# Patient Record
Sex: Female | Born: 1985 | Race: Asian | Hispanic: No | Marital: Married | Smoking: Never smoker
Health system: Southern US, Community
[De-identification: ages and names within clinical notes are randomized; demographics above are authoritative.]

## PROBLEM LIST (undated history)

## (undated) ENCOUNTER — Inpatient Hospital Stay (HOSPITAL_COMMUNITY): Payer: Self-pay

## (undated) DIAGNOSIS — O139 Gestational [pregnancy-induced] hypertension without significant proteinuria, unspecified trimester: Secondary | ICD-10-CM

## (undated) DIAGNOSIS — K219 Gastro-esophageal reflux disease without esophagitis: Secondary | ICD-10-CM

## (undated) DIAGNOSIS — R51 Headache: Secondary | ICD-10-CM

## (undated) DIAGNOSIS — O24419 Gestational diabetes mellitus in pregnancy, unspecified control: Secondary | ICD-10-CM

## (undated) DIAGNOSIS — E785 Hyperlipidemia, unspecified: Secondary | ICD-10-CM

## (undated) DIAGNOSIS — L700 Acne vulgaris: Secondary | ICD-10-CM

## (undated) DIAGNOSIS — J189 Pneumonia, unspecified organism: Secondary | ICD-10-CM

## (undated) DIAGNOSIS — M25473 Effusion, unspecified ankle: Secondary | ICD-10-CM

## (undated) DIAGNOSIS — N926 Irregular menstruation, unspecified: Secondary | ICD-10-CM

## (undated) DIAGNOSIS — N393 Stress incontinence (female) (male): Secondary | ICD-10-CM

## (undated) DIAGNOSIS — Z8759 Personal history of other complications of pregnancy, childbirth and the puerperium: Secondary | ICD-10-CM

## (undated) DIAGNOSIS — R519 Headache, unspecified: Secondary | ICD-10-CM

## (undated) DIAGNOSIS — T8332XA Displacement of intrauterine contraceptive device, initial encounter: Secondary | ICD-10-CM

## (undated) DIAGNOSIS — Z8632 Personal history of gestational diabetes: Secondary | ICD-10-CM

## (undated) DIAGNOSIS — Z973 Presence of spectacles and contact lenses: Secondary | ICD-10-CM

## (undated) HISTORY — DX: Acne vulgaris: L70.0

## (undated) HISTORY — PX: TOOTH EXTRACTION: SUR596

## (undated) HISTORY — PX: HAND SURGERY: SHX662

## (undated) HISTORY — DX: Irregular menstruation, unspecified: N92.6

## (undated) HISTORY — DX: Hyperlipidemia, unspecified: E78.5

## (undated) HISTORY — DX: Effusion, unspecified ankle: M25.473

---

## 2013-01-17 ENCOUNTER — Ambulatory Visit (INDEPENDENT_AMBULATORY_CARE_PROVIDER_SITE_OTHER): Payer: BC Managed Care – PPO | Admitting: Family Medicine

## 2013-01-17 ENCOUNTER — Other Ambulatory Visit (HOSPITAL_COMMUNITY)
Admission: RE | Admit: 2013-01-17 | Discharge: 2013-01-17 | Disposition: A | Discharge status: Home / Self Care | Payer: BC Managed Care – PPO | Source: Ambulatory Visit | Attending: Family Medicine | Admitting: Family Medicine

## 2013-01-17 ENCOUNTER — Encounter: Payer: Self-pay | Admitting: Family Medicine

## 2013-01-17 VITALS — BP 98/78 | HR 81 | Ht 66.0 in | Wt 185.0 lb

## 2013-01-17 DIAGNOSIS — Z01419 Encounter for gynecological examination (general) (routine) without abnormal findings: Secondary | ICD-10-CM | POA: Insufficient documentation

## 2013-01-17 DIAGNOSIS — M7989 Other specified soft tissue disorders: Secondary | ICD-10-CM

## 2013-01-17 DIAGNOSIS — Z124 Encounter for screening for malignant neoplasm of cervix: Secondary | ICD-10-CM

## 2013-01-17 DIAGNOSIS — Z0000 Encounter for general adult medical examination without abnormal findings: Secondary | ICD-10-CM

## 2013-01-17 LAB — POCT URINALYSIS DIPSTICK
Bilirubin, UA: NEGATIVE
Blood, UA: NEGATIVE
Glucose, UA: NEGATIVE
Ketones, UA: NEGATIVE
Leukocytes, UA: NEGATIVE
Nitrite, UA: NEGATIVE
Protein, UA: NEGATIVE
Spec Grav, UA: 1.01
Urobilinogen, UA: NEGATIVE
pH, UA: 6

## 2013-01-17 MED ORDER — POTASSIUM CHLORIDE ER 20 MEQ PO TBCR: 20.0000 meq | EXTENDED_RELEASE_TABLET | Freq: Two times a day (BID) | ORAL | Status: DC

## 2013-01-17 MED ORDER — FUROSEMIDE 40 MG PO TABS: 40.0000 mg | ORAL_TABLET | Freq: Every day | ORAL | Status: DC

## 2013-01-17 MED ORDER — NORETHIN ACE-ETH ESTRAD-FE 1-20 MG-MCG PO TABS: 1.0000 | ORAL_TABLET | Freq: Every day | ORAL | Status: DC

## 2013-01-17 NOTE — Patient Instructions (Addendum)
1)  Weight - HCG vs [place]  Calorie Counting Diet A calorie counting diet requires you to eat the number of calories that are right for you in a day. Calories are the measurement of how much energy you get from the food you eat. Eating the right amount of calories is important for staying at a healthy weight. If you eat too many calories, your body will store them as fat and you may gain weight. If you eat too few calories, you may lose weight. Counting the number of calories you eat during a day will help you know if you are eating the right amount. A Registered Dietitian can determine how many calories you need in a day. The amount of calories needed varies from person to person. If your goal is to lose weight, you will need to eat fewer calories. Losing weight can benefit you if you are overweight or have health problems such as heart disease, high blood pressure, or diabetes. If your goal is to gain weight, you will need to eat more calories. Gaining weight may be necessary if you have a certain health problem that causes your body to need more energy. TIPS Whether you are increasing or decreasing the number of calories you eat during a day, it may be hard to get used to changes in what you eat and drink. The following are tips to help you keep track of the number of calories you eat.  Measure foods at home with measuring cups. This helps you know the amount of food and number of calories you are eating.  Restaurants often serve food in amounts that are larger than 1 serving. While eating out, estimate how many servings of a food you are given. For example, a serving of cooked rice is  cup or about the size of half of a fist. Knowing serving sizes will help you be aware of how much food you are eating at restaurants.  Ask for smaller portion sizes or child-size portions at restaurants.  Plan to eat half of a meal at a restaurant. Take the rest home or share the other half with a  friend.  Read the Nutrition Facts panel on food labels for calorie content and serving size. You can find out how many servings are in a package, the size of a serving, and the number of calories each serving has.  For example, a package might contain 3 cookies. The Nutrition Facts panel on that package says that 1 serving is 1 cookie. Below that, it will say there are 3 servings in the container. The calories section of the Nutrition Facts label says there are 90 calories. This means there are 90 calories in 1 cookie (1 serving). If you eat 1 cookie you have eaten 90 calories. If you eat all 3 cookies, you have eaten 270 calories (3 servings x 90 calories = 270 calories). The list below tells you how big or small some common portion sizes are.  1 oz.........4 stacked dice.  3 oz........Marland KitchenDeck of cards.  1 tsp.......Marland KitchenTip of little finger.  1 tbs......Marland KitchenMarland KitchenThumb.  2 tbs.......Marland KitchenGolf ball.   cup......Marland KitchenHalf of a fist.  1 cup.......Marland KitchenA fist. KEEP A FOOD LOG Write down every food item you eat, the amount you eat, and the number of calories in each food you eat during the day. At the end of the day, you can add up the total number of calories you have eaten. It may help to keep a list like the one  below. Find out the calorie information by reading the Nutrition Facts panel on food labels. Breakfast  Bran cereal (1 cup, 110 calories).  Fat-free milk ( cup, 45 calories). Snack  Apple (1 medium, 80 calories). Lunch  Spinach (1 cup, 20 calories).  Tomato ( medium, 20 calories).  Chicken breast strips (3 oz, 165 calories).  Shredded cheddar cheese ( cup, 110 calories).  Light [place] dressing (2 tbs, 60 calories).  Whole-wheat bread (1 slice, 80 calories).  Tub margarine (1 tsp, 35 calories).  Vegetable soup (1 cup, 160 calories). Dinner  Pork chop (3 oz, 190 calories).  Brown rice (1 cup, 215 calories).  Steamed broccoli ( cup, 20 calories).  Strawberries (1  cup, 65  calories).  Whipped cream (1 tbs, 50 calories). Daily Calorie Total: 1425 Document Released: 09/27/2005 Document Revised: 12/20/2011 Document Reviewed: 03/24/2007 [facility] Patient Information 2013 [place], [place].  Exercise to Lose Weight Exercise and a healthy diet may help you lose weight. Your doctor may suggest specific exercises. EXERCISE IDEAS AND TIPS  Choose low-cost things you enjoy doing, such as walking, bicycling, or exercising to workout videos.  Take stairs instead of the elevator.  Walk during your lunch break.  Park your car further away from work or school.  Go to a gym or an exercise class.  Start with 5 to 10 minutes of exercise each day. Build up to 30 minutes of exercise 4 to 6 days a week.  Wear shoes with good support and comfortable clothes.  Stretch before and after working out.  Work out until you breathe harder and your heart beats faster.  Drink extra water when you exercise.  Do not do so much that you hurt yourself, feel dizzy, or get very short of breath. Exercises that burn about 150 calories:  Running 1  miles in 15 minutes.  Playing volleyball for 45 to 60 minutes.  Washing and waxing a car for 45 to 60 minutes.  Playing touch football for 45 minutes.  Walking 1  miles in 35 minutes.  Pushing a stroller 1  miles in 30 minutes.  Playing basketball for 30 minutes.  Raking leaves for 30 minutes.  Bicycling 5 miles in 30 minutes.  Walking 2 miles in 30 minutes.  Dancing for 30 minutes.  Shoveling snow for 15 minutes.  Swimming laps for 20 minutes.  Walking up stairs for 15 minutes.  Bicycling 4 miles in 15 minutes.  Gardening for 30 to 45 minutes.  Jumping rope for 15 minutes.  Washing windows or floors for 45 to 60 minutes. Document Released: 10/30/2010 Document Revised: 12/20/2011 Document Reviewed: 10/30/2010 [facility] Patient Information 2013 [place], [place].

## 2013-01-17 NOTE — Progress Notes (Signed)
Subjective:     Patient ID: Laura Sexton, female   DOB: 02-04-1986, 27 y.o.   MRN: [id number]  HPI Laura Sexton is here today for her annual CPE with pap.  She has done well since her last office visit. Her LMP is on 12/06/12.  She has been taking Loestrin to regulate her periods but she does not feel that they are regular.   Review of Systems  Constitutional: Negative for fever, activity change and unexpected weight change.  HENT: Negative for ear pain, congestion, neck pain, neck stiffness and voice change.   Eyes: Negative for visual disturbance.  Respiratory: Negative for cough, chest tightness and shortness of breath.   Cardiovascular: Negative for chest pain and palpitations.  Gastrointestinal: Negative for abdominal pain, diarrhea and constipation.  Genitourinary: Negative for dysuria, vaginal discharge, vaginal pain and pelvic pain.  Musculoskeletal: Negative for myalgias, back pain and arthralgias.  Skin: Negative for rash.  Neurological: Negative for dizziness, weakness and headaches.  Psychiatric/Behavioral: Negative for suicidal ideas. The patient is not nervous/anxious.        Objective:   Physical Exam  Constitutional: She is oriented to person, place, and time. She appears well-developed and well-nourished.  HENT:  Head: Normocephalic and atraumatic.  Right Ear: External ear normal.  Left Ear: External ear normal.  Nose: Nose normal.  Mouth/Throat: Oropharynx is clear and moist.  Eyes: Conjunctivae and EOM are normal. Pupils are equal, round, and reactive to light.  Neck: Normal range of motion. No thyromegaly present.  Cardiovascular: Normal rate, regular rhythm, normal heart sounds and intact distal pulses.  Exam reveals no gallop and no friction rub.   No murmur heard. Pulmonary/Chest: Effort normal and breath sounds normal.  Abdominal: Soft. Bowel sounds are normal.  Genitourinary: Uterus normal. No vaginal discharge found.  Musculoskeletal: Normal range of motion.  She exhibits edema. She exhibits no tenderness.  Lymphadenopathy:    She has no cervical adenopathy.  Neurological: She is alert and oriented to person, place, and time. She has normal reflexes.  Skin: Skin is warm and dry.  Psychiatric: She has a normal mood and affect. Her behavior is normal. Judgment and thought content normal.       Assessment:     CPE  Leg Swelling     Plan:     Refilled her Loestrin Prescriptions given for Lasix and Potassium.

## 2013-01-21 ENCOUNTER — Encounter: Payer: Self-pay | Admitting: Family Medicine

## 2013-01-21 DIAGNOSIS — Z124 Encounter for screening for malignant neoplasm of cervix: Secondary | ICD-10-CM | POA: Insufficient documentation

## 2013-01-21 DIAGNOSIS — M7989 Other specified soft tissue disorders: Secondary | ICD-10-CM | POA: Insufficient documentation

## 2013-01-21 DIAGNOSIS — Z0000 Encounter for general adult medical examination without abnormal findings: Secondary | ICD-10-CM | POA: Insufficient documentation

## 2013-04-11 ENCOUNTER — Ambulatory Visit (INDEPENDENT_AMBULATORY_CARE_PROVIDER_SITE_OTHER): Payer: BC Managed Care – PPO | Admitting: Family Medicine

## 2013-04-11 ENCOUNTER — Encounter: Payer: Self-pay | Admitting: Family Medicine

## 2013-04-11 VITALS — BP 100/69 | HR 75 | Wt 188.0 lb

## 2013-04-11 DIAGNOSIS — E669 Obesity, unspecified: Secondary | ICD-10-CM

## 2013-04-11 NOTE — Progress Notes (Signed)
  Subjective:    Patient ID: Laura Sexton, female    DOB: 1986-01-17, 27 y.o.   MRN: [id number]  HPI  Laura Sexton is here today to begin the Step By Step Diet & Fitness Program.  She has tried numerous diet programs and has not been successful with them.  She feels that she needs to lose weight to improve her general health.  Review of Systems  Constitutional: Positive for activity change, appetite change, fatigue and unexpected weight change.  HENT: Negative.   Respiratory: Negative.   Cardiovascular: Negative.   Gastrointestinal: Negative.   Genitourinary: Negative.   Neurological: Negative.   Psychiatric/Behavioral: Negative.    Past Medical History  Diagnosis Date  . Cystic acne    Family History  Problem Relation Age of Onset  . Hypertension Mother   . Hyperlipidemia Father   . Hearing loss Father   . Heart disease Father 80    CABG x 3  . Asthma Brother   . Diabetes Paternal Uncle   . Heart disease Maternal Grandmother   . Hyperlipidemia Maternal Grandmother   . Heart disease Maternal Grandfather   . Hyperlipidemia Maternal Grandfather   . Heart disease Paternal Grandmother   . Hyperlipidemia Paternal Grandmother   . Heart disease Paternal Grandfather   . 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   Tobacco Use/Exposure:  None    Alcohol Use:  None   Drug Use:  None   Diet:  Regular   Exercise:  [employer] (5 x week)    Hobbies: Cake Baking             Current Outpatient Prescriptions on File Prior to Visit  Medication Sig Dispense Refill  . Dapsone (ACZONE) 5 % topical gel Apply topically 2 (two) times daily.      . furosemide (LASIX) 40 MG tablet Take 1 tablet (40 mg total) by mouth daily.  30  tablet  11  . norethindrone-ethinyl estradiol (JUNEL FE,GILDESS FE,LOESTRIN FE) 1-20 MG-MCG tablet Take 1 tablet by mouth daily.  1 Package  12  . potassium chloride 20 MEQ TBCR Take 20 mEq by mouth 2 (two) times daily.  60 tablet  11  . doxycycline (DORYX) 100 MG DR capsule Take 100 mg by mouth 2 (two) times daily.       No current facility-administered medications on file prior to visit.      Objective:   Physical Exam  Constitutional: She appears well-nourished. No distress.  HENT:  Head: Normocephalic.  Eyes: No scleral icterus.  Neck: No thyromegaly present.  Cardiovascular: Normal rate, regular rhythm and normal heart sounds.   Pulmonary/Chest: Effort normal and breath sounds normal.  Abdominal: There is no tenderness.  Musculoskeletal: She exhibits no edema and no tenderness.  Neurological: She is alert.  Skin: Skin is warm and dry.  Psychiatric: She has a normal mood and affect. Her behavior is normal. Judgment and thought content normal.          Assessment & Plan:

## 2013-04-23 ENCOUNTER — Ambulatory Visit: Payer: BC Managed Care – PPO | Admitting: *Deleted

## 2013-04-23 VITALS — Wt 183.1 lb

## 2013-04-30 ENCOUNTER — Ambulatory Visit: Payer: BC Managed Care – PPO | Admitting: *Deleted

## 2013-04-30 VITALS — Wt 180.0 lb

## 2013-05-07 ENCOUNTER — Ambulatory Visit: Payer: BC Managed Care – PPO | Admitting: *Deleted

## 2013-05-14 ENCOUNTER — Ambulatory Visit (INDEPENDENT_AMBULATORY_CARE_PROVIDER_SITE_OTHER): Payer: BC Managed Care – PPO | Admitting: Family Medicine

## 2013-05-14 VITALS — BP 112/70 | HR 76 | Resp 16 | Ht 64.5 in | Wt 176.0 lb

## 2013-05-14 DIAGNOSIS — E669 Obesity, unspecified: Secondary | ICD-10-CM | POA: Insufficient documentation

## 2013-05-14 DIAGNOSIS — E663 Overweight: Secondary | ICD-10-CM

## 2013-05-14 MED ORDER — PHENTERMINE HCL 37.5 MG PO TABS: 37.5000 mg | ORAL_TABLET | Freq: Every day | ORAL | Status: DC

## 2013-05-14 NOTE — Progress Notes (Signed)
  Subjective:    Patient ID: Laura Sexton, female    DOB: 11-05-1985, 27 y.o.   MRN: [id number]  HPI  Laura Sexton is here today for a follow up of her weight loss. She has just completed her 4 th week of the "Step By Step"  Program.  She is taking Phendimetrazine without any problem. She has also been receiving HCG injections without difficulty.  She has been following the 500 calorie diet and has lost 12 lbs since the beginning of the program.    Review of Systems  Constitutional: Negative.   Gastrointestinal: Negative.   Endocrine: Negative.   Psychiatric/Behavioral: Negative.     Past Medical History  Diagnosis Date  . Cystic acne     Family History  Problem Relation Age of Onset  . Hypertension Mother   . Hyperlipidemia Father   . Hearing loss Father   . Heart disease Father 59    CABG x 3  . Asthma Brother   . Diabetes Paternal Uncle   . Heart disease Maternal Grandmother   . Hyperlipidemia Maternal Grandmother   . Heart disease Maternal Grandfather   . Hyperlipidemia Maternal Grandfather   . Heart disease Paternal Grandmother   . Hyperlipidemia Paternal Grandmother   . Heart disease Paternal Grandfather   . 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   Tobacco Use/Exposure:  None    Alcohol Use:  None   Drug Use:  None   Diet:  Regular   Exercise:  [employer] (5 x week)    Hobbies: Cake Baking                Objective:   Physical Exam  Vitals reviewed. Constitutional: She is oriented to person, place, and time.  Eyes: Conjunctivae are normal. No scleral icterus.  Neck: Neck supple. No thyromegaly present.  Cardiovascular: Normal rate, regular rhythm and normal heart sounds.    Pulmonary/Chest: Effort normal and breath sounds normal.  Musculoskeletal: She exhibits no edema and no tenderness.  Lymphadenopathy:    She has no cervical adenopathy.  Neurological: She is alert and oriented to person, place, and time.  Skin: Skin is warm and dry.  Psychiatric: She has a normal mood and affect. Her behavior is normal. Judgment and thought content normal.          Assessment & Plan:

## 2013-05-14 NOTE — Patient Instructions (Addendum)

## 2013-05-14 NOTE — Assessment & Plan Note (Signed)
The patient is going to begin the "Step By Step" program.  They will take daily IM injections of hCG and will follow the 500 calorie diet as illustrated in Dr. Simeons' "Pounds & Inches".  They will also take Phendimetrazine to suppress appetite. They were also instructed to get at least one hour of exercise daily. 

## 2013-06-30 ENCOUNTER — Encounter: Payer: Self-pay | Admitting: Family Medicine

## 2013-06-30 DIAGNOSIS — E663 Overweight: Secondary | ICD-10-CM | POA: Insufficient documentation

## 2013-06-30 NOTE — Assessment & Plan Note (Signed)
She has done well on Phase II of the HCG diet. She is ready to move on to Phase III where she will limit her intake of starch and sugar and calories to 1000.    

## 2014-01-18 ENCOUNTER — Encounter: Payer: Self-pay | Admitting: Family Medicine

## 2014-01-18 ENCOUNTER — Ambulatory Visit (INDEPENDENT_AMBULATORY_CARE_PROVIDER_SITE_OTHER): Payer: BC Managed Care – PPO | Admitting: Family Medicine

## 2014-01-18 VITALS — BP 108/75 | HR 73 | Resp 16 | Wt 198.0 lb

## 2014-01-18 DIAGNOSIS — R5383 Other fatigue: Secondary | ICD-10-CM

## 2014-01-18 DIAGNOSIS — M79609 Pain in unspecified limb: Secondary | ICD-10-CM

## 2014-01-18 DIAGNOSIS — Z5181 Encounter for therapeutic drug level monitoring: Secondary | ICD-10-CM

## 2014-01-18 DIAGNOSIS — M7989 Other specified soft tissue disorders: Secondary | ICD-10-CM

## 2014-01-18 DIAGNOSIS — E282 Polycystic ovarian syndrome: Secondary | ICD-10-CM

## 2014-01-18 DIAGNOSIS — R5381 Other malaise: Secondary | ICD-10-CM

## 2014-01-18 DIAGNOSIS — M79674 Pain in right toe(s): Secondary | ICD-10-CM

## 2014-01-18 DIAGNOSIS — D500 Iron deficiency anemia secondary to blood loss (chronic): Secondary | ICD-10-CM

## 2014-01-18 DIAGNOSIS — Z3009 Encounter for other general counseling and advice on contraception: Secondary | ICD-10-CM

## 2014-01-18 DIAGNOSIS — Z30011 Encounter for initial prescription of contraceptive pills: Secondary | ICD-10-CM

## 2014-01-18 DIAGNOSIS — E785 Hyperlipidemia, unspecified: Secondary | ICD-10-CM

## 2014-01-18 MED ORDER — FUROSEMIDE 40 MG PO TABS: ORAL_TABLET | ORAL | Status: DC

## 2014-01-18 MED ORDER — LEVONORGEST-ETH ESTRAD 91-DAY 0.15-0.03 MG PO TABS: 1.0000 | ORAL_TABLET | Freq: Every day | ORAL | Status: DC

## 2014-01-18 MED ORDER — POTASSIUM CHLORIDE ER 10 MEQ PO TBCR: 10.0000 meq | EXTENDED_RELEASE_TABLET | Freq: Three times a day (TID) | ORAL | Status: DC

## 2014-01-18 NOTE — Progress Notes (Signed)
Subjective:    Patient ID: Laura Sexton, female    DOB: [birth year], 28 y.o.   MRN: [id number]  HPI  Laura Sexton is here today complaining of pain in her right great toe.  She says its especially painful when she wears shoes. She has been having this pain for about 20 days.  She describes the pain  as being moderate in nature and she feels that she is worsening.  She has also been having some edema and would like to get on birth control.     Review of Systems  Constitutional: Negative for activity change, appetite change and fatigue.  Cardiovascular: Positive for leg swelling.  Musculoskeletal:       Right great toe joint pain  Psychiatric/Behavioral: The patient is not nervous/anxious.   All other systems reviewed and are negative.    Past Medical History  Diagnosis Date  . Cystic acne      History reviewed. No pertinent past surgical history.   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   Tobacco Use/Exposure:  None    Alcohol Use:  None   Drug Use:  None   Diet:  Regular   Exercise:  [employer] (5 x week)    Hobbies: Cake Baking               Family History  Problem Relation Age of Onset  . Hypertension Mother   . Hyperlipidemia Father   . Hearing loss Father   . Heart disease Father [id number]    CABG x 3  . Asthma Brother   . Diabetes Paternal Uncle   . Heart disease Maternal Grandmother   . Hyperlipidemia Maternal Grandmother   . Heart disease Maternal Grandfather   . Hyperlipidemia Maternal Grandfather   . Heart disease Paternal Grandmother   . Hyperlipidemia Paternal Grandmother   . Heart disease Paternal Grandfather   . Hyperlipidemia Paternal Grandfather      No Known Allergies      Objective:   Physical Exam  Nursing  note and vitals reviewed. Constitutional: She is oriented to person, place, and time. She appears well-nourished.  Cardiovascular: Normal rate.   Pulmonary/Chest: Effort normal.  Musculoskeletal: Normal range of motion.  Pain in right great toe joint  Neurological: She is alert and oriented to person, place, and time.  Skin: Skin is dry.  Psychiatric: She has a normal mood and affect. Her behavior is normal. Judgment and thought content normal.      Assessment & Plan:    [name] was seen today for toe pain.  We are checking labs to assess several conditions.  She will start on Lasix for edema and birth control pills for acne and birth control for the future.    Diagnoses and associated orders for this visit:  Pain of right great toe - Uric acid  Anemia - CBC w/Diff  Other and unspecified hyperlipidemia - Lipid panel  Other malaise and fatigue - TSH  Encounter for therapeutic drug monitoring - COMPLETE METABOLIC PANEL WITH GFR  Swelling of limb - furosemide (LASIX) 40 MG tablet; Take 1/2 - 1 tab as needed for increased swelling. - potassium chloride (K-DUR) 10 MEQ tablet; Take 1 tablet (10  mEq total) by mouth 3 (three) times daily.  Oral contraceptive prescribed - levonorgestrel-ethinyl estradiol (SEASONALE) 0.15-0.03 MG tablet; Take 1 tablet by mouth daily.  PCOS (polycystic ovarian syndrome) - Insulin, fasting - Testosterone  Other Orders - Testosterone - Insulin, fasting   TIME SPENT "FACE TO FACE" WITH PATIENT -  30 MINS

## 2014-01-18 NOTE — Patient Instructions (Signed)
1)  Pain in Toe - ? Elevated Uric Acid - Gout vs Bunion - we are checking your level.  2)  Swelling - Lasix/Potassium as needed; limit sodium intake  3)  Acne/Birth Control - Pills.    Gout Gout is an inflammatory arthritis caused by a buildup of uric acid crystals in the joints. Uric acid is a chemical that is normally present in the blood. When the level of uric acid in the blood is too high it can form crystals that deposit in your joints and tissues. This causes joint redness, soreness, and swelling (inflammation). Repeat attacks are common. Over time, uric acid crystals can form into masses (tophi) near a joint, destroying bone and causing disfigurement. Gout is treatable and often preventable. CAUSES  The disease begins with elevated levels of uric acid in the blood. Uric acid is produced by your body when it breaks down a naturally found substance called purines. Certain foods you eat, such as meats and fish, contain high amounts of purines. Causes of an elevated uric acid level include:  Being passed down from parent to child (heredity).  Diseases that cause increased uric acid production (such as obesity, psoriasis, and certain cancers).  Excessive alcohol use.  Diet, especially diets rich in meat and seafood.  Medicines, including certain cancer-fighting medicines (chemotherapy), water pills (diuretics), and aspirin.  Chronic kidney disease. The kidneys are no longer able to remove uric acid well.  Problems with metabolism. Conditions strongly associated with gout include:  Obesity.  High blood pressure.  High cholesterol.  Diabetes. Not everyone with elevated uric acid levels gets gout. It is not understood why some people get gout and others do not. Surgery, joint injury, and eating too much of certain foods are some of the factors that can lead to gout attacks. SYMPTOMS   An attack of gout comes on quickly. It causes intense pain with redness, swelling, and warmth in  a joint.  Fever can occur.  Often, only one joint is involved. Certain joints are more commonly involved:  Base of the big toe.  Knee.  Ankle.  Wrist.  Finger. Without treatment, an attack usually goes away in a few days to weeks. Between attacks, you usually will not have symptoms, which is different from many other forms of arthritis. DIAGNOSIS  Your caregiver will suspect gout based on your symptoms and exam. In some cases, tests may be recommended. The tests may include:  Blood tests.  Urine tests.  X-rays.  Joint fluid exam. This exam requires a needle to remove fluid from the joint (arthrocentesis). Using a microscope, gout is confirmed when uric acid crystals are seen in the joint fluid. TREATMENT  There are two phases to gout treatment: treating the sudden onset (acute) attack and preventing attacks (prophylaxis).  Treatment of an Acute Attack.  Medicines are used. These include anti-inflammatory medicines or steroid medicines.  An injection of steroid medicine into the affected joint is sometimes necessary.  The painful joint is rested. Movement can worsen the arthritis.  You may use warm or cold treatments on painful joints, depending which works best for you.  Treatment to Prevent Attacks.  If you suffer from frequent gout attacks, your caregiver may advise preventive medicine. These medicines are started after the acute attack subsides. These medicines either help your kidneys eliminate uric acid from your body or decrease your uric acid production. You may need to stay on these medicines for a very long time.  The early phase of treatment  with preventive medicine can be associated with an increase in acute gout attacks. For this reason, during the first few months of treatment, your caregiver may also advise you to take medicines usually used for acute gout treatment. Be sure you understand your caregiver's directions. Your caregiver may make several  adjustments to your medicine dose before these medicines are effective.  Discuss dietary treatment with your caregiver or dietitian. Alcohol and drinks high in sugar and fructose and foods such as meat, poultry, and seafood can increase uric acid levels. Your caregiver or dietician can advise you on drinks and foods that should be limited. HOME CARE INSTRUCTIONS   Do not take aspirin to relieve pain. This raises uric acid levels.  Only take over-the-counter or prescription medicines for pain, discomfort, or fever as directed by your caregiver.  Rest the joint as much as possible. When in bed, keep sheets and blankets off painful areas.  Keep the affected joint raised (elevated).  Apply warm or cold treatments to painful joints. Use of warm or cold treatments depends on which works best for you.  Use crutches if the painful joint is in your leg.  Drink enough fluids to keep your urine clear or pale yellow. This helps your body get rid of uric acid. Limit alcohol, sugary drinks, and fructose drinks.  Follow your dietary instructions. Pay careful attention to the amount of protein you eat. Your daily diet should emphasize fruits, vegetables, whole grains, and fat-free or low-fat milk products. Discuss the use of coffee, vitamin C, and cherries with your caregiver or dietician. These may be helpful in lowering uric acid levels.  Maintain a healthy body weight. SEEK MEDICAL CARE IF:   You develop diarrhea, vomiting, or any side effects from medicines.  You do not feel better in 24 hours, or you are getting worse. SEEK IMMEDIATE MEDICAL CARE IF:   Your joint becomes suddenly more tender, and you have chills or a fever. MAKE SURE YOU:   Understand these instructions.  Will watch your condition.  Will get help right away if you are not doing well or get worse. Document Released: 09/24/2000 Document Revised: 01/22/2013 Document Reviewed: 05/10/2012 [facility] Patient Information 2014  [place].

## 2014-01-21 LAB — LIPID PANEL
Cholesterol: 244 mg/dL — ABNORMAL HIGH (ref 0–200)
HDL: 56 mg/dL (ref >39)
LDL Cholesterol: 145 mg/dL — ABNORMAL HIGH (ref 0–99)
Total CHOL/HDL Ratio: 4.4 Ratio
Triglycerides: 215 mg/dL — ABNORMAL HIGH (ref <150)
VLDL: 43 mg/dL — ABNORMAL HIGH (ref 0–40)

## 2014-01-21 LAB — COMPLETE METABOLIC PANEL WITH GFR
ALT: 21 U/L (ref 0–35)
AST: 20 U/L (ref 0–37)
Albumin: 4.6 g/dL (ref 3.5–5.2)
Alkaline Phosphatase: 39 U/L (ref 39–117)
BUN: 12 mg/dL (ref 6–23)
CO2: 24 mEq/L (ref 19–32)
Calcium: 9.7 mg/dL (ref 8.4–10.5)
Chloride: 103 mEq/L (ref 96–112)
Creat: 0.65 mg/dL (ref 0.50–1.10)
GFR, Est African American: >89 mL/min
GFR, Est Non African American: >89 mL/min
Glucose, Bld: 96 mg/dL (ref 70–99)
Potassium: 4.6 mEq/L (ref 3.5–5.3)
Sodium: 137 mEq/L (ref 135–145)
Total Bilirubin: 0.3 mg/dL (ref 0.2–1.2)
Total Protein: 7.8 g/dL (ref 6.0–8.3)

## 2014-01-21 LAB — CBC WITH DIFFERENTIAL/PLATELET
Basophils Absolute: 0 10*3/uL (ref 0.0–0.1)
Basophils Relative: 0 % (ref 0–1)
Eosinophils Absolute: 0.2 10*3/uL (ref 0.0–0.7)
Eosinophils Relative: 2 % (ref 0–5)
HCT: 35.7 % — ABNORMAL LOW (ref 36.0–46.0)
Hemoglobin: 11.9 g/dL — ABNORMAL LOW (ref 12.0–15.0)
Lymphocytes Relative: 45 % (ref 12–46)
Lymphs Abs: 4.6 10*3/uL — ABNORMAL HIGH (ref 0.7–4.0)
MCH: 24.8 pg — ABNORMAL LOW (ref 26.0–34.0)
MCHC: 33.3 g/dL (ref 30.0–36.0)
MCV: 74.4 fL — ABNORMAL LOW (ref 78.0–100.0)
Monocytes Absolute: 0.5 10*3/uL (ref 0.1–1.0)
Monocytes Relative: 5 % (ref 3–12)
Neutro Abs: 4.9 10*3/uL (ref 1.7–7.7)
Neutrophils Relative %: 48 % (ref 43–77)
Platelets: 302 10*3/uL (ref 150–400)
RBC: 4.8 MIL/uL (ref 3.87–5.11)
RDW: 15.2 % (ref 11.5–15.5)
WBC: 10.2 10*3/uL (ref 4.0–10.5)

## 2014-01-21 LAB — TESTOSTERONE: Testosterone: 27 ng/dL (ref 10–70)

## 2014-01-21 LAB — URIC ACID: Uric Acid, Serum: 6.4 mg/dL (ref 2.4–7.0)

## 2014-01-21 LAB — TSH: TSH: 3.207 u[IU]/mL (ref 0.350–4.500)

## 2014-01-22 LAB — INSULIN, FASTING: Insulin fasting, serum: 17 u[IU]/mL (ref 3–28)

## 2014-02-20 ENCOUNTER — Ambulatory Visit (INDEPENDENT_AMBULATORY_CARE_PROVIDER_SITE_OTHER): Payer: BC Managed Care – PPO | Admitting: Family Medicine

## 2014-02-20 ENCOUNTER — Encounter: Payer: Self-pay | Admitting: Family Medicine

## 2014-02-20 VITALS — BP 106/73 | HR 88 | Resp 16 | Ht 64.5 in | Wt 196.0 lb

## 2014-02-20 DIAGNOSIS — Z0000 Encounter for general adult medical examination without abnormal findings: Secondary | ICD-10-CM

## 2014-02-20 DIAGNOSIS — M7989 Other specified soft tissue disorders: Secondary | ICD-10-CM

## 2014-02-20 LAB — POCT URINALYSIS DIPSTICK
Bilirubin, UA: NEGATIVE
Glucose, UA: NEGATIVE
Ketones, UA: NEGATIVE
Leukocytes, UA: NEGATIVE
Nitrite, UA: NEGATIVE
Protein, UA: NEGATIVE
Spec Grav, UA: <=1.005
Urobilinogen, UA: NEGATIVE
pH, UA: 7

## 2014-02-20 NOTE — Progress Notes (Signed)
Subjective:    Patient ID: Laura Sexton, female    DOB: 06-24-1986, 28 y.o.   MRN: [id number]  HPI  Laura Sexton is here today for her annual CPE with pap and to discuss her recent lab results.  She continues to have leg and feet swelling.  She was given Lasix at her last visit but has not started it yet.  She also still has some pain in her right big toe.     Review of Systems  Constitutional: Negative for activity change, appetite change and fatigue.  Respiratory: Negative for shortness of breath.   Cardiovascular: Positive for leg swelling. Negative for chest pain and palpitations.  Gastrointestinal: Negative for abdominal pain.  Endocrine: Negative for polyphagia.  Genitourinary: Negative for vaginal bleeding, vaginal discharge, vaginal pain and menstrual problem.  Neurological: Negative for headaches.  Psychiatric/Behavioral: Negative for behavioral problems and sleep disturbance. The patient is not nervous/anxious.   All other systems reviewed and are negative.    Past Medical History  Diagnosis Date  . 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 Hypertension Mother   . Hyperlipidemia Father   . Hearing loss Father   . Heart disease Father [id number]    CABG x 3  . Asthma Brother   . Diabetes Paternal Uncle   . Heart disease Maternal Grandmother   . Hyperlipidemia Maternal Grandmother   . Heart disease Maternal Grandfather   . Hyperlipidemia Maternal Grandfather   .  Heart disease Paternal Grandmother   . Hyperlipidemia Paternal Grandmother   . Heart disease Paternal Grandfather   . Hyperlipidemia Paternal Grandfather      Current Outpatient Prescriptions on File Prior to Visit  Medication Sig Dispense Refill  . furosemide (LASIX) 40 MG tablet Take 1/2 - 1 tab as needed for increased swelling.  30 tablet  3  . levonorgestrel-ethinyl estradiol (SEASONALE) 0.15-0.03 MG tablet Take 1 tablet by mouth daily.  1 Package  4  . potassium chloride (K-DUR) 10 MEQ tablet Take 1 tablet (10 mEq total) by mouth 3 (three) times daily.  90 tablet  3   No current facility-administered medications on file prior to visit.     No Known Allergies      Objective:   Physical Exam  Nursing note and vitals reviewed. Constitutional: She is oriented to person, place, and time. She appears well-developed and well-nourished.  HENT:  Head: Normocephalic and atraumatic.  Right Ear: External ear normal.  Left Ear: External ear normal.  Nose: Nose normal.  Mouth/Throat: Oropharynx is clear and moist.  Eyes: Conjunctivae and EOM are normal. Pupils are equal, round, and reactive to light.  Neck: Normal range of motion. No thyromegaly present.  Cardiovascular: Normal rate, regular rhythm, normal heart sounds and  intact distal pulses.  Exam reveals no gallop and no friction rub.   No murmur heard. Pulmonary/Chest: Effort normal and breath sounds normal. Right breast exhibits no inverted nipple, no mass, no nipple discharge, no skin change and no tenderness. Left breast exhibits no inverted nipple, no mass, no nipple discharge, no skin change and no tenderness. Breasts are symmetrical.  Abdominal: Soft. Bowel sounds are normal. Hernia confirmed negative in the right inguinal area and confirmed negative in the left inguinal area.  Genitourinary: Vagina normal and uterus normal. Pelvic exam was performed with patient supine. There is no rash, tenderness or lesion on the right  labia. There is no rash, tenderness or lesion on the left labia. No vaginal discharge found.  Musculoskeletal: Normal range of motion. She exhibits no edema and no tenderness.  Lymphadenopathy:    She has no cervical adenopathy.       Right: No inguinal adenopathy present.       Left: No inguinal adenopathy present.  Neurological: She is alert and oriented to person, place, and time. She has normal reflexes.  Skin: Skin is warm and dry.  Psychiatric: She has a normal mood and affect. Her behavior is normal. Judgment and thought content normal.      Assessment & Plan:    Laura Sexton was seen today for annual exam.  Diagnoses and associated orders for this visit:  Routine general medical examination at a health care facility - POCT urinalysis dipstick  Bilateral swelling of feet

## 2014-02-20 NOTE — Patient Instructions (Signed)
1)  Swelling - Take 1/2 -1 of the Lasix for swelling and add potassium if you get cramps.    2)  Acne - You might consider Accutane in the future for 4-6 months.     Isotretinoin capsules What is this medicine? ISOTRETINOIN (eye soe TRET i noyn) treats severe acne that has not responded to other therapy like antibiotics. This medicine may be used for other purposes; ask your health care provider or pharmacist if you have questions. COMMON BRAND NAME(S): Absorica, Accutane, Amnesteem , Claravis , MYORISAN, Sotret, ZENATANE What should I tell my health care provider before I take this medicine? They need to know if you have any of these conditions: -heart disease -high blood cholesterol or triglycerides -inflammatory bowel disease -liver disease -mental problems, such as depression, psychosis, attempted suicide, or a family history of mental problems -osteoporosis, osteomalacia, or other bone disorders -pancreatitis -an unusual or allergic reaction to isotretinoin, vitamin A or related drugs, parabens, other medicines, foods, dyes, or preservatives -pregnant or trying to get pregnant -breast-feeding How should I use this medicine? Take this medicine by mouth with a full glass of water. Follow the directions on the prescription label. Do not chew or suck on the capsules. Take your medicine at regular intervals. Do not take your medicine more often than directed. Do not stop taking except on your doctor's advice. A special MedGuide will be given to you by the pharmacist with each prescription and refill. Be sure to read this information carefully each time. Talk to your pediatrician regarding the use of this medicine in children. Special care may be needed. Overdosage: If you think you have taken too much of this medicine contact a poison control center or emergency room at once. NOTE: This medicine is only for you. Do not share this medicine with others. What if I miss a dose? If you miss a  dose, skip that dose. Do not take double or extra doses. If you take more than your prescribed dose, call your doctor or poison control center right away. What may interact with this medicine? Do not take this medicine with any of the following medications: -vitamins and other supplements containing vitamin A This medicine may also interact with the following medications: -alcohol -benzoyl peroxide, salicylic acid, or other drying medicines used for acne -medicines for seizures -orlistat -other drugs that make you more sensitive to the sun such as sulfa drugs -progestin-only birth control hormones -st. john's wort -steroid medicines like prednisone or cortisone -tetracycline antibiotics like doxycycline and tetracycline -warfarin This list may not describe all possible interactions. Give your health care provider a list of all the medicines, herbs, non-prescription drugs, or dietary supplements you use. Also tell them if you smoke, drink alcohol, or use illegal drugs. Some items may interact with your medicine. What should I watch for while using this medicine? You may experience a flare in your acne during the initial treatment period. You will need to see your doctor or health care professional monthly to get a new prescription and to check on your progress and for side effects. To receive this medicine, you, your doctor and your pharmacy must be registered in the [facility]. You may only receive up to a 30 day supply of this medicine at one time. You will need a new prescription for each refill. Your prescription must be filled within 7 days of your doctor's office visit. This medicine can cause birth defects. Do not get pregnant while taking this drug. Females  will need to have 2 negative pregnancy tests before starting this medicine and then monthly pregnancy tests during treatment, even if you are not sexually active. Use 2 reliable forms of birth control together for 1 month prior to,  during, and for 1 month after stopping this medicine. Avoid using birth control pills that do not contain estrogen. They may not work while you are taking this medicine. If you become pregnant, miss a menstrual cycle, or stop using birth control, you must immediately stop taking this medicine. If you are pregnant, report it to FDA MedWatch at 1-800-FDA-1088 and the iPLEDGE pregnancy registry at (941)196-18981-([phone] 130-4653. Severe birth defects may occur even if just one dose is taken. Do not breast-feed while taking this medicine or for 1 month after stopping treatment. Do not give blood while taking this medicine and for 30 days after completion of treatment to avoid exposing pregnant women to this medicine through the donated blood. Some patients have become depressed or developed serious mental problems while taking this medicine or soon after stopping. Stop taking this medicine if you start feeling depressed or have thoughts of violence or suicide. Contact your doctor. This medicine can increase cholesterol and triglyceride levels and decrease HDL (the good cholesterol) levels. Your health care provider will monitor these levels and recommend appropriate therapy, including changes in diet or prescription drugs, if necessary. Alcohol can increase the risk of developing high cholesterol or high blood lipids. Avoid alcoholic drinks while you are taking this medicine. If you wear contact lenses, they may feel uncomfortable. If your eyes get dry, check with your eye doctor. This medicine may decrease your night vision or cause other changes in vision. If you experience any change in vision, stop taking this medicine and see an eye doctor. This medicine can make you more sensitive to the sun. Keep out of the sun. If you cannot avoid being in the sun, wear protective clothing and use sunscreen. Do not use sun lamps or tanning beds/booths. Cosmetic procedures to smooth your skin including waxing, dermabrasion, or laser therapy  should be avoided during therapy and for at least 6 months after you stop because of the possibility of scarring. Check with your health care provider for advice about when you can have cosmetic procedures. This medicine may affect your blood sugar levels. If you have diabetes check with your doctor or health care professional if you notice any change in your blood sugar tests. What side effects may I notice from receiving this medicine? Side effects that you should report to your doctor or health care professional as soon as possible: -allergic reactions like skin rash, itching or hives, swelling of the face, lips, or tongue -breathing problems -changes in menstrual cycle -changes in vision, like blurred or double vision or decreased night vision -chest pain -depression -dizziness -fainting -hearing loss or ringing in the ears -hives, skin rash -increased irritability, anger, aggression or thoughts of violence -increased urination and/or thirst or dark urine -irregular heartbeat -loss of interest in usual activities -muscle or joint pain -muscle weakness with or without pain -nausea and vomiting -severe diarrhea -severe headache -severe stomach pain -slurred speech or trouble swallowing -start to have thoughts about hurting yourself -swelling of face or mouth -unusual bruising or bleeding -yellowing of the eyes or skin Side effects that usually do not require medical attention (report to your doctor or health care professional if they continue or are bothersome): -chapped lips -dry mouth, nose or skin -flushing -hair loss, increased fragility of  hair -headache (mild) This list may not describe all possible side effects. Call your doctor for medical advice about side effects. You may report side effects to FDA at 1-800-FDA-1088. Where should I keep my medicine? Keep out of the reach of children. Store at room temperature between 15 and 30 degrees C (59 and 86 degrees F). Throw  away any unused medicine after the expiration date. NOTE: This sheet is a summary. It may not cover all possible information. If you have questions about this medicine, talk to your doctor, pharmacist, or health care provider.  2014, Elsevier/Gold Standard. (2008-02-13 16:57:13)

## 2014-02-22 ENCOUNTER — Encounter: Payer: BC Managed Care – PPO | Admitting: Family Medicine

## 2014-05-11 ENCOUNTER — Encounter: Payer: Self-pay | Admitting: Family Medicine

## 2014-05-27 ENCOUNTER — Ambulatory Visit (INDEPENDENT_AMBULATORY_CARE_PROVIDER_SITE_OTHER): Payer: BC Managed Care – PPO | Admitting: Physician Assistant

## 2014-05-27 ENCOUNTER — Encounter: Payer: Self-pay | Admitting: Physician Assistant

## 2014-05-27 VITALS — BP 108/84 | HR 96 | Temp 98.6°F | Resp 16 | Ht 64.5 in | Wt 201.8 lb

## 2014-05-27 DIAGNOSIS — R8290 Unspecified abnormal findings in urine: Secondary | ICD-10-CM

## 2014-05-27 DIAGNOSIS — R109 Unspecified abdominal pain: Secondary | ICD-10-CM

## 2014-05-27 DIAGNOSIS — R82998 Other abnormal findings in urine: Secondary | ICD-10-CM

## 2014-05-27 DIAGNOSIS — Z3201 Encounter for pregnancy test, result positive: Secondary | ICD-10-CM

## 2014-05-27 LAB — POCT URINALYSIS DIPSTICK
BILIRUBIN UA: NEGATIVE
Glucose, UA: NEGATIVE
KETONES UA: NEGATIVE
Nitrite, UA: NEGATIVE
Protein, UA: NEGATIVE
Spec Grav, UA: <=1.005
Urobilinogen, UA: 0.2
pH, UA: 6.5

## 2014-05-27 LAB — POCT URINE PREGNANCY: Preg Test, Ur: POSITIVE

## 2014-05-27 NOTE — Patient Instructions (Signed)
You will be contacted by OB/GYN office for an appointment for routine gynecological care.   Please read information given on prenatal care.  For headaches, its ok to add a little bit of caffeine back in to the date. Stay well hydrated and add some fiber to your diet. Start multivitamin with 400 mcg of folic acid to take daily.  Prenatal Care  WHAT IS PRENATAL CARE?  Prenatal care means health care during your pregnancy, before your baby is born. It is very important to take care of yourself and your baby during your pregnancy by:   Getting early prenatal care. If you know you are pregnant, or think you might be pregnant, call your health care provider as soon as possible. Schedule a visit for a prenatal exam.  Getting regular prenatal care. Follow your health care provider's schedule for blood and other necessary tests. Do not miss appointments.  Doing everything you can to keep yourself and your baby healthy during your pregnancy.  Getting complete care. Prenatal care should include evaluation of the medical, dietary, educational, psychological, and social needs of you and your significant other. The medical and genetic history of your family and the family of your baby's father should be discussed with your health care provider.  Discussing with your health care provider:  Prescription, over-the-counter, and herbal medicines that you take.  Any history of substance abuse, alcohol use, smoking, and illegal drug use.  Any history of domestic abuse and violence.  Immunizations you have received.  Your nutrition and diet.  The amount of exercise you do.  Any environmental and occupational hazards to which you are exposed.  History of sexually transmitted infections for both you and your partner.  Previous pregnancies you have had. WHY IS PRENATAL CARE SO IMPORTANT?  By regularly seeing your health care provider, you help ensure that problems can be identified early so that they can be  treated as soon as possible. Other problems might be prevented. Many studies have shown that early and regular prenatal care is important for the health of mothers and their babies.  HOW CAN I TAKE CARE OF MYSELF WHILE I AM PREGNANT?  Here are ways to take care of yourself and your baby:   Start or continue taking your multivitamin with 400 micrograms (mcg) of folic acid every day.  Get early and regular prenatal care. It is very important to see a health care provider during your pregnancy. Your health care provider will check at each visit to make sure that you and your baby are healthy. If there are any problems, action can be taken right away to help you and your baby.  Eat a healthy diet that includes:  Fruits.  Vegetables.  Foods low in saturated fat.  Whole grains.  Calcium-rich foods, such as milk, yogurt, and hard cheeses.  Drink 6-8 glasses of liquids a day.  Unless your health care provider tells you not to, try to be physically active for 30 minutes, most days of the week. If you are pressed for time, you can get your activity in through 10-minute segments, three times a day.  Do not smoke, drink alcohol, or use drugs. These can cause long-term damage to your baby. Talk with your health care provider about steps to take to stop smoking. Talk with a member of your faith community, a counselor, a trusted friend, or your health care provider if you are concerned about your alcohol or drug use.  Ask your health care provider before  taking any medicine, even over-the-counter medicines. Some medicines are not safe to take during pregnancy.  Get plenty of rest and sleep.  Avoid hot tubs and saunas during pregnancy.  Do not have X-rays taken unless absolutely necessary and with the recommendation of your health care provider. A lead shield can be placed on your abdomen to protect your baby when X-rays are taken in other parts of your body.  Do not empty the cat litter when you  are pregnant. It may contain a parasite that causes an infection called toxoplasmosis, which can cause birth defects. Also, use gloves when working in garden areas used by cats.  Do not eat uncooked or undercooked meats or fish.  Do not eat soft, mold-ripened cheeses (Brie, Camembert, and chevre) or soft, blue-veined cheese (Danish blue and Roquefort).  Stay away from toxic chemicals like:  Insecticides.  Solvents (some cleaners or paint thinners).  Lead.  Mercury.  Sexual intercourse may continue until the end of the pregnancy, unless you have a medical problem or there is a problem with the pregnancy and your health care provider tells you not to.  Do not wear high-heel shoes, especially during the second half of the pregnancy. You can lose your balance and fall.  Do not take long trips, unless absolutely necessary. Be sure to see your health care provider before going on the trip.  Do not sit in one position for more than 2 hours when on a trip.  Take a copy of your medical records when going on a trip. Know where a hospital is located in the city you are visiting, in case of an emergency.  Most dangerous household products will have pregnancy warnings on their labels. Ask your health care provider about products if you are unsure.  Limit or eliminate your caffeine intake from coffee, tea, sodas, medicines, and chocolate.  Many women continue working through pregnancy. Staying active might help you stay healthier. If you have a question about the safety or the hours you work at your particular job, talk with your health care provider.  Get informed:  Read books.  Watch videos.  Go to childbirth classes for you and your significant other.  Talk with experienced moms.  Ask your health care provider about childbirth education classes for you and your partner. Classes can help you and your partner prepare for the birth of your baby.  Ask about a baby doctor (pediatrician)  and methods and pain medicine for labor, delivery, and possible cesarean delivery. HOW OFTEN SHOULD I SEE MY HEALTH CARE PROVIDER DURING PREGNANCY?  Your health care provider will give you a schedule for your prenatal visits. You will have visits more often as you get closer to the end of your pregnancy. An average pregnancy lasts about 40 weeks.  A typical schedule includes visiting your health care provider:   About once each month during your first 6 months of pregnancy.  Every 2 weeks during the next 2 months.  Weekly in the last month, until the delivery date. Your health care provider will probably want to see you more often if:  You are older than 35 years.  Your pregnancy is high risk because you have certain health problems or problems with the pregnancy, such as:  Diabetes.  High blood pressure.  The baby is not growing on schedule, according to the dates of the pregnancy. Your health care provider will do special tests to make sure you and your baby are not having any serious  problems. WHAT HAPPENS DURING PRENATAL VISITS?   At your first prenatal visit, your health care provider will do a physical exam and talk to you about your health history and the health history of your partner and your family. Your health care provider will be able to tell you what date to expect your baby to be born on.  Your first physical exam will include checks of your blood pressure, measurements of your height and weight, and an exam of your pelvic organs. Your health care provider will do a Pap test if you have not had one recently and will do cultures of your cervix to make sure there is no infection.  At each prenatal visit, there will be tests of your blood, urine, blood pressure, weight, and the progress of the baby will be checked.  At your later prenatal visits, your health care provider will check how you are doing and how your baby is developing. You may have a number of tests done as  your pregnancy progresses.  Ultrasound exams are often used to check on your baby's growth and health.  You may have more urine and blood tests, as well as special tests, if needed. These may include amniocentesis to examine fluid in the pregnancy sac, stress tests to check how the baby responds to contractions, or a biophysical profile to measure your baby's well-being. Your health care provider will explain the tests and why they are necessary.  You should be tested for high blood sugar (gestational diabetes) between the 24th and 28th weeks of your pregnancy.  You should discuss with your health care provider your plans to breastfeed or bottle-feed your baby.  Each visit is also a chance for you to learn about staying healthy during pregnancy and to ask questions. Document Released: 09/30/2003 Document Revised: 10/02/2013 Document Reviewed: 12/12/2013 [facility] Patient Information 2015 [place], [place]. This information is not intended to replace advice given to you by your health care provider. Make sure you discuss any questions you have with your health care provider.

## 2014-05-27 NOTE — Progress Notes (Signed)
Patient presents to clinic today to establish care.  Patient denies significant past medical history. Does endorse irregular menstrual cycles.  Patient here for confirmation of positive home pregnancy test. Last menstrual period was 2 months ago. Is sexually active with one female partner, her husband. Does not use protection.  Past Medical History  Diagnosis Date  . Cystic acne   . Ankle edema   . Irregular menstrual cycle   . Hyperlipidemia     No current outpatient prescriptions on file prior to visit.   No current facility-administered medications on file prior to visit.    No Known Allergies  Family History  Problem Relation Age of Onset  . Hypertension Mother     Living  . Hyperlipidemia Mother   . Diabetes Mother   . Hyperlipidemia Father     Living  . Hearing loss Father   . Heart disease Father 71    CABG x 3  . Glaucoma Father   . Asthma Brother     Childhood, resolved  . Cancer Paternal Uncle   . Heart disease Maternal Grandmother   . Hyperlipidemia Maternal Grandmother   . Hepatitis Maternal Grandmother   . Heart disease Maternal Grandfather   . Hyperlipidemia Maternal Grandfather   . Heart disease Paternal Grandmother   . Hyperlipidemia Paternal Grandmother   . Heart disease Paternal Grandfather   . Hyperlipidemia Paternal Grandfather   . Lymphoma Paternal Aunt   . Healthy Sister     x2    History   Social History  . Marital Status: Single    Spouse Name: N/A    Number of Children: 0  . Years of Education: 14   Occupational History  . CMA Other    [employer]    Social History Main Topics  . Smoking status: Never Smoker   . Smokeless tobacco: Never Used  . Alcohol Use: No  . Drug Use: No  . Sexual Activity: Yes    Birth Control/ Protection: None   Other Topics Concern  . 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  All other ROS are negative.  BP 108/84  Pulse 96  Temp(Src) 98.6 F (37 C) (Oral)  Resp 16  Ht 5' 4.5" (1.638 m)  Wt 201 lb 12 oz (91.513 kg)  BMI 34.11 kg/m2  SpO2 98%  LMP 01/14/2014  Physical Exam  Vitals reviewed. Constitutional: She is oriented to person, place, and time and well-developed, well-nourished, and in no distress.  HENT:  Head: Normocephalic and atraumatic.  Right Ear: External ear normal.  Left Ear: External ear normal.  Nose: Nose normal.  Mouth/Throat: Oropharynx is clear and moist. No oropharyngeal exudate.  Tympanic membranes within normal limits bilaterally.  Eyes: Conjunctivae are normal. Pupils are equal, round, and reactive to light.  Neck: Neck  supple. No thyromegaly present.  Cardiovascular: Normal rate, regular rhythm, normal heart sounds and intact distal pulses.   Pulmonary/Chest: Effort normal and breath sounds normal. No respiratory distress. She has no wheezes. She has no rales. She exhibits no tenderness.  Lymphadenopathy:    She has no cervical adenopathy.  Neurological: She is alert and oriented to person, place, and time.  Skin: Skin is warm and dry. No rash noted.  Psychiatric: Affect normal.    Recent Results (from the past 2160 hour(s))  POCT URINE PREGNANCY     Status: None   Collection Time    05/27/14  9:52 AM      Result Value Ref Range   Preg Test, Ur Positive    POCT URINALYSIS DIPSTICK     Status: None   Collection Time    05/27/14 10:53 AM      Result Value Ref Range   Color, UA amber     Clarity, UA clear     Comment: dark   Glucose, UA neg     Bilirubin, UA neg      Ketones, UA neg     Spec Grav, UA <=1.005     Blood, UA trace     pH, UA 6.5     Protein, UA neg     Urobilinogen, UA 0.2     Nitrite, UA neg     Leukocytes, UA Trace    CULTURE, URINE COMPREHENSIVE     Status: None   Collection Time    05/27/14 11:54 AM      Result Value Ref Range   Colony Count 25,000 COLONIES/ML     Organism ID, Bacteria DIPTHEROIDS (CORYNEBACTERIUM SPECIES)     Comment: Standardized susceptibility testing for this     organism is not available.    Assessment/Plan: Positive pregnancy test In office pregnancy test positive. This confirms positive home pregnancy test. Discussed prenatal care with patient. Will start a multivitamin with folic acid daily. Patient given handout on medications to avoid while pregnant. Progress to OB/GYN for further management.  Abnormal finding on urinalysis Urine dip at the same time pregnancy test was obtained. Some potential evidence for UTI. Will send for culture. Patient is pregnant. Will treat based on results.

## 2014-05-27 NOTE — Progress Notes (Signed)
Pre visit review using our clinic review tool, if applicable. No additional management support is needed unless otherwise documented below in the visit note/SLS  

## 2014-05-29 ENCOUNTER — Telehealth: Payer: Self-pay | Admitting: Physician Assistant

## 2014-05-29 DIAGNOSIS — N3000 Acute cystitis without hematuria: Secondary | ICD-10-CM

## 2014-05-29 LAB — CULTURE, URINE COMPREHENSIVE: Colony Count: 25000

## 2014-05-29 MED ORDER — CEPHALEXIN 500 MG PO CAPS: 500.0000 mg | ORAL_CAPSULE | Freq: Two times a day (BID) | ORAL | Status: DC

## 2014-05-29 NOTE — Telephone Encounter (Signed)
Patient Unavailable; left message with contact name & number w/pt's mother for call back/SLS

## 2014-05-29 NOTE — Telephone Encounter (Signed)
Urine culture positive for infection.  Will send in Rx for Keflex as it is safe to use in pregnancy.  Take as directed.

## 2014-05-30 NOTE — Telephone Encounter (Signed)
Left message for pt to return call. LDM

## 2014-05-30 NOTE — Telephone Encounter (Signed)
Patient informed, understood & agreed/SLS  

## 2014-06-03 DIAGNOSIS — Z3201 Encounter for pregnancy test, result positive: Secondary | ICD-10-CM | POA: Insufficient documentation

## 2014-06-03 DIAGNOSIS — R8290 Unspecified abnormal findings in urine: Secondary | ICD-10-CM | POA: Insufficient documentation

## 2014-06-03 NOTE — Assessment & Plan Note (Signed)
In office pregnancy test positive. This confirms positive home pregnancy test. Discussed prenatal care with patient. Will start a multivitamin with folic acid daily. Patient given handout on medications to avoid while pregnant. Progress to OB/GYN for further management.

## 2014-06-03 NOTE — Assessment & Plan Note (Signed)
Urine dip at the same time pregnancy test was obtained. Some potential evidence for UTI. Will send for culture. Patient is pregnant. Will treat based on results.

## 2014-06-05 ENCOUNTER — Telehealth: Payer: Self-pay

## 2014-06-05 NOTE — Telephone Encounter (Signed)
Pt stated that "she needs a letter for medicaid saying she's pregnant and her due date. Her OBGYN can't do it because she has no insurance." LDM

## 2014-06-06 NOTE — Telephone Encounter (Signed)
I can write a letter confirming her pregnancy.  However I cannot estimate her due date as she has a history of irregular periods.  Due date will need to be estimated based on an Ultrasound.  Is her OB/GYN not able to perform this?

## 2014-06-13 NOTE — Telephone Encounter (Signed)
See Below:

## 2014-06-13 NOTE — Telephone Encounter (Signed)
Advised patient of what was in the phone note

## 2014-07-02 ENCOUNTER — Other Ambulatory Visit: Payer: Self-pay | Admitting: Obstetrics & Gynecology

## 2014-07-02 LAB — OB RESULTS CONSOLE ABO/RH: RH TYPE: POSITIVE

## 2014-07-02 LAB — OB RESULTS CONSOLE ANTIBODY SCREEN: ANTIBODY SCREEN: NEGATIVE

## 2014-07-02 LAB — OB RESULTS CONSOLE GC/CHLAMYDIA
Chlamydia: NEGATIVE
GC PROBE AMP, GENITAL: NEGATIVE

## 2014-07-02 LAB — OB RESULTS CONSOLE HEPATITIS B SURFACE ANTIGEN: HEP B S AG: NEGATIVE

## 2014-07-02 LAB — OB RESULTS CONSOLE RPR: RPR: NONREACTIVE

## 2014-07-02 LAB — OB RESULTS CONSOLE HIV ANTIBODY (ROUTINE TESTING): HIV: NONREACTIVE

## 2014-07-02 LAB — OB RESULTS CONSOLE RUBELLA ANTIBODY, IGM: RUBELLA: IMMUNE

## 2014-07-03 LAB — CYTOLOGY - PAP

## 2014-08-07 ENCOUNTER — Encounter: Payer: Medicaid Other | Attending: Obstetrics & Gynecology

## 2014-08-07 VITALS — Ht 64.0 in | Wt 202.6 lb

## 2014-08-07 DIAGNOSIS — R7309 Other abnormal glucose: Secondary | ICD-10-CM

## 2014-08-07 DIAGNOSIS — Z713 Dietary counseling and surveillance: Secondary | ICD-10-CM | POA: Diagnosis not present

## 2014-08-07 DIAGNOSIS — R7302 Impaired glucose tolerance (oral): Secondary | ICD-10-CM | POA: Insufficient documentation

## 2014-08-07 NOTE — Progress Notes (Signed)
  Patient was seen on 08/07/14 for Gestational Diabetes self-management class at the [facility]. The following learning objectives were met by the patient during this course:   States the definition of Gestational Diabetes  States why dietary management is important in controlling blood glucose  Describes the effects each nutrient has on blood glucose levels  Demonstrates ability to create a balanced meal plan  Demonstrates carbohydrate counting   States when to check blood glucose levels  Demonstrates proper blood glucose monitoring techniques  States the effect of stress and exercise on blood glucose levels  States the importance of limiting caffeine and abstaining from alcohol and smoking  Blood glucose monitor given: Accu Chek Nano BG Monitoring Kit Lot # [id number] Exp: 06/11/15 Blood glucose reading: 99 mg/dL  Patient instructed to monitor glucose levels: FBS: 60 - <90 1 hour: <140 2 hour: <120  *Patient received handouts:  Nutrition Diabetes and Pregnancy  Carbohydrate Counting List  Patient will be seen for follow-up as needed.

## 2014-10-11 DIAGNOSIS — O24419 Gestational diabetes mellitus in pregnancy, unspecified control: Secondary | ICD-10-CM

## 2014-10-11 HISTORY — DX: Gestational diabetes mellitus in pregnancy, unspecified control: O24.419

## 2014-10-11 NOTE — L&D Delivery Note (Signed)
I was called for delivery and made it here in 10 minutes but baby delivered just before I entered room.   Patient was C/C/+3 and pushed for 15 minutes with epidural- last push involuntary.   NSVD  female infant, Apgars 8,9, weight P.   The patient had a second degree perineal laceration and a second degree subclitoral, periurethral tear- first repaired with 2-0 vicryl R and second with 3-0 vicryl R and catheter in place. Fundus was firm. EBL was expected amount- about 500cc and pt had occ atony that was corrected with pitocin and cytotec. Placenta was delivered intact- a gentle exploration of uterus revealed no RPOC. Vagina was clear.  Baby was vigorous and doing skin to skin with mother.  Laura Sexton A

## 2014-10-16 ENCOUNTER — Inpatient Hospital Stay (HOSPITAL_COMMUNITY)
Admission: AD | Admit: 2014-10-16 | Payer: Medicaid Other | Source: Ambulatory Visit | Admitting: Obstetrics & Gynecology

## 2014-12-20 ENCOUNTER — Other Ambulatory Visit: Payer: Self-pay | Admitting: Obstetrics & Gynecology

## 2014-12-20 LAB — OB RESULTS CONSOLE GBS: GBS: NEGATIVE

## 2015-01-02 ENCOUNTER — Encounter (HOSPITAL_COMMUNITY): Payer: Self-pay | Admitting: *Deleted

## 2015-01-02 ENCOUNTER — Inpatient Hospital Stay (HOSPITAL_COMMUNITY)
Admission: AD | Admit: 2015-01-02 | Discharge: 2015-01-02 | Disposition: A | Discharge status: Home / Self Care | Payer: Medicaid Other | Source: Ambulatory Visit | Attending: Obstetrics and Gynecology | Admitting: Obstetrics and Gynecology

## 2015-01-02 DIAGNOSIS — O368131 Decreased fetal movements, third trimester, fetus 1: Secondary | ICD-10-CM | POA: Diagnosis not present

## 2015-01-02 DIAGNOSIS — Z79899 Other long term (current) drug therapy: Secondary | ICD-10-CM | POA: Diagnosis not present

## 2015-01-02 DIAGNOSIS — O24419 Gestational diabetes mellitus in pregnancy, unspecified control: Secondary | ICD-10-CM | POA: Diagnosis not present

## 2015-01-02 DIAGNOSIS — Z3A37 37 weeks gestation of pregnancy: Secondary | ICD-10-CM | POA: Insufficient documentation

## 2015-01-02 DIAGNOSIS — O368130 Decreased fetal movements, third trimester, not applicable or unspecified: Secondary | ICD-10-CM | POA: Insufficient documentation

## 2015-01-02 HISTORY — DX: Gestational diabetes mellitus in pregnancy, unspecified control: O24.419

## 2015-01-02 NOTE — Discharge Instructions (Signed)
Fetal Movement Counts °Patient Name: __________________________________________________ Patient Due Date: ____________________ °Performing a fetal movement count is highly recommended in high-risk pregnancies, but it is good for every pregnant woman to do. Your health care provider may ask you to start counting fetal movements at 28 weeks of the pregnancy. Fetal movements often increase: °· After eating a full meal. °· After physical activity. °· After eating or drinking something sweet or cold. °· At rest. °Pay attention to when you feel the baby is most active. This will help you notice a pattern of your baby's sleep and wake cycles and what factors contribute to an increase in fetal movement. It is important to perform a fetal movement count at the same time each day when your baby is normally most active.  °HOW TO COUNT FETAL MOVEMENTS °· Find a quiet and comfortable area to sit or lie down on your left side. Lying on your left side provides the best blood and oxygen circulation to your baby. °· Write down the day and time on a sheet of paper or in a journal. °· Start counting kicks, flutters, swishes, rolls, or jabs in a 2-hour period. You should feel at least 10 movements within 2 hours. °· If you do not feel 10 movements in 2 hours, wait 2-3 hours and count again. Look for a change in the pattern or not enough counts in 2 hours. °SEEK MEDICAL CARE IF: °· You feel less than 10 counts in 2 hours, tried twice. °· There is no movement in over an hour. °· The pattern is changing or taking longer each day to reach 10 counts in 2 hours. °· You feel the baby is not moving as he or she usually does. °Date: ____________ Movements: ____________ Start time: ____________ Finish time: ____________  °Date: ____________ Movements: ____________ Start time: ____________ Finish time: ____________ °Date: ____________ Movements: ____________ Start time: ____________ Finish time: ____________ °Date: ____________ Movements:  ____________ Start time: ____________ Finish time: ____________ °Date: ____________ Movements: ____________ Start time: ____________ Finish time: ____________ °Date: ____________ Movements: ____________ Start time: ____________ Finish time: ____________ °Date: ____________ Movements: ____________ Start time: ____________ Finish time: ____________ °Date: ____________ Movements: ____________ Start time: ____________ Finish time: ____________  °Date: ____________ Movements: ____________ Start time: ____________ Finish time: ____________ °Date: ____________ Movements: ____________ Start time: ____________ Finish time: ____________ °Date: ____________ Movements: ____________ Start time: ____________ Finish time: ____________ °Date: ____________ Movements: ____________ Start time: ____________ Finish time: ____________ °Date: ____________ Movements: ____________ Start time: ____________ Finish time: ____________ °Date: ____________ Movements: ____________ Start time: ____________ Finish time: ____________ °Date: ____________ Movements: ____________ Start time: ____________ Finish time: ____________  °Date: ____________ Movements: ____________ Start time: ____________ Finish time: ____________ °Date: ____________ Movements: ____________ Start time: ____________ Finish time: ____________ °Date: ____________ Movements: ____________ Start time: ____________ Finish time: ____________ °Date: ____________ Movements: ____________ Start time: ____________ Finish time: ____________ °Date: ____________ Movements: ____________ Start time: ____________ Finish time: ____________ °Date: ____________ Movements: ____________ Start time: ____________ Finish time: ____________ °Date: ____________ Movements: ____________ Start time: ____________ Finish time: ____________  °Date: ____________ Movements: ____________ Start time: ____________ Finish time: ____________ °Date: ____________ Movements: ____________ Start time: ____________ Finish  time: ____________ °Date: ____________ Movements: ____________ Start time: ____________ Finish time: ____________ °Date: ____________ Movements: ____________ Start time: ____________ Finish time: ____________ °Date: ____________ Movements: ____________ Start time: ____________ Finish time: ____________ °Date: ____________ Movements: ____________ Start time: ____________ Finish time: ____________ °Date: ____________ Movements: ____________ Start time: ____________ Finish time: ____________  °Date: ____________ Movements: ____________ Start time: ____________ Finish   time: ____________ °Date: ____________ Movements: ____________ Start time: ____________ Finish time: ____________ °Date: ____________ Movements: ____________ Start time: ____________ Finish time: ____________ °Date: ____________ Movements: ____________ Start time: ____________ Finish time: ____________ °Date: ____________ Movements: ____________ Start time: ____________ Finish time: ____________ °Date: ____________ Movements: ____________ Start time: ____________ Finish time: ____________ °Date: ____________ Movements: ____________ Start time: ____________ Finish time: ____________  °Date: ____________ Movements: ____________ Start time: ____________ Finish time: ____________ °Date: ____________ Movements: ____________ Start time: ____________ Finish time: ____________ °Date: ____________ Movements: ____________ Start time: ____________ Finish time: ____________ °Date: ____________ Movements: ____________ Start time: ____________ Finish time: ____________ °Date: ____________ Movements: ____________ Start time: ____________ Finish time: ____________ °Date: ____________ Movements: ____________ Start time: ____________ Finish time: ____________ °Date: ____________ Movements: ____________ Start time: ____________ Finish time: ____________  °Date: ____________ Movements: ____________ Start time: ____________ Finish time: ____________ °Date: ____________  Movements: ____________ Start time: ____________ Finish time: ____________ °Date: ____________ Movements: ____________ Start time: ____________ Finish time: ____________ °Date: ____________ Movements: ____________ Start time: ____________ Finish time: ____________ °Date: ____________ Movements: ____________ Start time: ____________ Finish time: ____________ °Date: ____________ Movements: ____________ Start time: ____________ Finish time: ____________ °Date: ____________ Movements: ____________ Start time: ____________ Finish time: ____________  °Date: ____________ Movements: ____________ Start time: ____________ Finish time: ____________ °Date: ____________ Movements: ____________ Start time: ____________ Finish time: ____________ °Date: ____________ Movements: ____________ Start time: ____________ Finish time: ____________ °Date: ____________ Movements: ____________ Start time: ____________ Finish time: ____________ °Date: ____________ Movements: ____________ Start time: ____________ Finish time: ____________ °Date: ____________ Movements: ____________ Start time: ____________ Finish time: ____________ °Document Released: 10/27/2006 Document Revised: 02/11/2014 Document Reviewed: 07/24/2012 °[employer]® Patient Information ©2015 [employer]. This information is not intended to replace advice given to you by your health care provider. Make sure you discuss any questions you have with your health care provider. °Third Trimester of Pregnancy °The third trimester is from week 29 through week 42, months 7 through 9. The third trimester is a time when the fetus is growing rapidly. At the end of the ninth month, the fetus is about 20 inches in length and weighs 6-10 pounds.  °BODY CHANGES °Your body goes through many changes during pregnancy. The changes vary from woman to woman.  °· Your weight will continue to increase. You can expect to gain 25-35 pounds (11-16 kg) by the end of the pregnancy. °· You may begin to get  stretch marks on your hips, abdomen, and breasts. °· You may urinate more often because the fetus is moving lower into your pelvis and pressing on your bladder. °· You may develop or continue to have heartburn as a result of your pregnancy. °· You may develop constipation because certain hormones are causing the muscles that push waste through your intestines to slow down. °· You may develop hemorrhoids or swollen, bulging veins (varicose veins). °· You may have pelvic pain because of the weight gain and pregnancy hormones relaxing your joints between the bones in your pelvis. Backaches may result from overexertion of the muscles supporting your posture. °· You may have changes in your hair. These can include thickening of your hair, rapid growth, and changes in texture. Some women also have hair loss during or after pregnancy, or hair that feels dry or thin. Your hair will most likely return to normal after your baby is born. °· Your breasts will continue to grow and be tender. A yellow discharge may leak from your breasts called colostrum. °· Your belly button may stick out. °· You may feel short of   breath because of your expanding uterus. °· You may notice the fetus "dropping," or moving lower in your abdomen. °· You may have a bloody mucus discharge. This usually occurs a few days to a week before labor begins. °· Your cervix becomes thin and soft (effaced) near your due date. °WHAT TO EXPECT AT YOUR PRENATAL EXAMS  °You will have prenatal exams every 2 weeks until week 36. Then, you will have weekly prenatal exams. During a routine prenatal visit: °· You will be weighed to make sure you and the fetus are growing normally. °· Your blood pressure is taken. °· Your abdomen will be measured to track your baby's growth. °· The fetal heartbeat will be listened to. °· Any test results from the previous visit will be discussed. °· You may have a cervical check near your due date to see if you have effaced. °At around  36 weeks, your caregiver will check your cervix. At the same time, your caregiver will also perform a test on the secretions of the vaginal tissue. This test is to determine if a type of bacteria, Group B streptococcus, is present. Your caregiver will explain this further. °Your caregiver may ask you: °· What your birth plan is. °· How you are feeling. °· If you are feeling the baby move. °· If you have had any abnormal symptoms, such as leaking fluid, bleeding, severe headaches, or abdominal cramping. °· If you have any questions. °Other tests or screenings that may be performed during your third trimester include: °· Blood tests that check for low iron levels (anemia). °· Fetal testing to check the health, activity level, and growth of the fetus. Testing is done if you have certain medical conditions or if there are problems during the pregnancy. °FALSE LABOR °You may feel small, irregular contractions that eventually go away. These are called Braxton Hicks contractions, or false labor. Contractions may last for hours, days, or even weeks before true labor sets in. If contractions come at regular intervals, intensify, or become painful, it is best to be seen by your caregiver.  °SIGNS OF LABOR  °· Menstrual-like cramps. °· Contractions that are 5 minutes apart or less. °· Contractions that start on the top of the uterus and spread down to the lower abdomen and back. °· A sense of increased pelvic pressure or back pain. °· A watery or bloody mucus discharge that comes from the vagina. °If you have any of these signs before the 37th week of pregnancy, call your caregiver right away. You need to go to the hospital to get checked immediately. °HOME CARE INSTRUCTIONS  °· Avoid all smoking, herbs, alcohol, and unprescribed drugs. These chemicals affect the formation and growth of the baby. °· Follow your caregiver's instructions regarding medicine use. There are medicines that are either safe or unsafe to take during  pregnancy. °· Exercise only as directed by your caregiver. Experiencing uterine cramps is a good sign to stop exercising. °· Continue to eat regular, healthy meals. °· Wear a good support bra for breast tenderness. °· Do not use hot tubs, steam rooms, or saunas. °· Wear your seat belt at all times when driving. °· Avoid raw meat, uncooked cheese, cat litter boxes, and soil used by cats. These carry germs that can cause birth defects in the baby. °· Take your prenatal vitamins. °· Try taking a stool softener (if your caregiver approves) if you develop constipation. Eat more high-fiber foods, such as fresh vegetables or fruit and whole grains. Drink   plenty of fluids to keep your urine clear or pale yellow. °· Take warm sitz baths to soothe any pain or discomfort caused by hemorrhoids. Use hemorrhoid cream if your caregiver approves. °· If you develop varicose veins, wear support hose. Elevate your feet for 15 minutes, 3-4 times a day. Limit salt in your diet. °· Avoid heavy lifting, wear low heal shoes, and practice good posture. °· Rest a lot with your legs elevated if you have leg cramps or low back pain. °· Visit your dentist if you have not gone during your pregnancy. Use a soft toothbrush to brush your teeth and be gentle when you floss. °· A sexual relationship may be continued unless your caregiver directs you otherwise. °· Do not travel far distances unless it is absolutely necessary and only with the approval of your caregiver. °· Take prenatal classes to understand, practice, and ask questions about the labor and delivery. °· Make a trial run to the hospital. °· Pack your hospital bag. °· Prepare the baby's nursery. °· Continue to go to all your prenatal visits as directed by your caregiver. °SEEK MEDICAL CARE IF: °· You are unsure if you are in labor or if your water has broken. °· You have dizziness. °· You have mild pelvic cramps, pelvic pressure, or nagging pain in your abdominal area. °· You have  persistent nausea, vomiting, or diarrhea. °· You have a bad smelling vaginal discharge. °· You have pain with urination. °SEEK IMMEDIATE MEDICAL CARE IF:  °· You have a fever. °· You are leaking fluid from your vagina. °· You have spotting or bleeding from your vagina. °· You have severe abdominal cramping or pain. °· You have rapid weight loss or gain. °· You have shortness of breath with chest pain. °· You notice sudden or extreme swelling of your face, hands, ankles, feet, or legs. °· You have not felt your baby move in over an hour. °· You have severe headaches that do not go away with medicine. °· You have vision changes. °Document Released: 09/21/2001 Document Revised: 10/02/2013 Document Reviewed: 11/28/2012 °[employer]® Patient Information ©2015 [employer]. This information is not intended to replace advice given to you by your health care provider. Make sure you discuss any questions you have with your health care provider. ° ° °

## 2015-01-02 NOTE — MAU Provider Note (Signed)
History     CSN: [id number]  Arrival date and time: 01/02/15 2122   First Provider Initiated Contact with Patient 01/02/15 2306      No chief complaint on file.  HPI Comments: Laura Sexton is a 29 y.o. G1P0 at [redacted]w[redacted]d who presents today with decreased fetal movement. She states that the fetus was moving normally yesterday, but today she has only felt two movements. She states that since she has been here and on the monitor the fetus has been moving. She has GDM and is having antenatal testing in the office. She has a NST scheduled for 01/07/15. She denies any abdominal pain, contractions, VB or LOF. She is on 5 mg glyburide at bedtime for GDM.   Other This is a new problem. The current episode started today. The problem has been gradually improving. Pertinent negatives include no abdominal pain, nausea or vomiting. She has tried nothing for the symptoms.      Past Medical History  Diagnosis Date  . Cystic acne   . Ankle edema   . Irregular menstrual cycle   . Hyperlipidemia   . Gestational diabetes mellitus, class A2 2016    on glyburide    Past Surgical History  Procedure Laterality Date  . Hand surgery      Cyst on Left Thumb-Age 67  . Tooth extraction      Family History  Problem Relation Age of Onset  . Hypertension Mother     Living  . Hyperlipidemia Mother   . Diabetes Mother   . Hyperlipidemia Father     Living  . Hearing loss Father   . Heart disease Father 80    CABG x 3  . Glaucoma Father   . Asthma Brother     Childhood, resolved  . Cancer Paternal Uncle   . Heart disease Maternal Grandmother   . Hyperlipidemia Maternal Grandmother   . Hepatitis Maternal Grandmother   . Heart disease Maternal Grandfather   . Hyperlipidemia Maternal Grandfather   . Heart disease Paternal Grandmother   . Hyperlipidemia Paternal Grandmother   . Heart disease Paternal Grandfather   . Hyperlipidemia Paternal Grandfather   . Lymphoma Paternal Aunt   . Healthy Sister      x2    History  Substance Use Topics  . Smoking status: Never Smoker   . Smokeless tobacco: Never Used  . Alcohol Use: No    Allergies: No Known Allergies  Prescriptions prior to admission  Medication Sig Dispense Refill Last Dose  . glyBURIDE (DIABETA) 5 MG tablet Take 5 mg by mouth at bedtime.   01/01/2015 at Unknown time  . Prenatal Vit-Fe Fumarate-FA (PRENATAL MULTIVITAMIN) TABS tablet Take 1 tablet by mouth at bedtime.   01/01/2015 at Unknown time  . cephALEXin (KEFLEX) 500 MG capsule Take 1 capsule (500 mg total) by mouth 2 (two) times daily. (Patient not taking: Reported on 01/02/2015) 14 capsule 0 Completed Course at Unknown time    Review of Systems  Gastrointestinal: Positive for constipation (regular BM, but "difficult". ). Negative for nausea, vomiting, abdominal pain and diarrhea.  Genitourinary: Negative for dysuria, urgency and frequency.   Physical Exam   Blood pressure 123/82, pulse 89, temperature 98.3 F (36.8 C), temperature source Oral, resp. rate 18, height  (1.651 m), weight 96.333 kg (212 lb 6 oz), last menstrual period 01/14/2014, SpO2 100 %.  Physical Exam  Nursing note and vitals reviewed. Constitutional: She is oriented to person, place, and time. She appears well-developed and  well-nourished. No distress.  Cardiovascular: Normal rate.   Respiratory: Effort normal.  GI: Soft. There is no tenderness. There is no rebound.  Neurological: She is alert and oriented to person, place, and time.  Skin: Skin is warm and dry.  Psychiatric: She has a normal mood and affect.     MAU Course  Procedures  MDM 2317: D/W Dr. Henderson CloudHorvath, ok for DC home.  Assessment and Plan   1. Decreased fetal movement, third trimester, fetus 1    DC home Fetal kick counts Labor precautions  Return to MAU as needed  Follow-up Information    Follow up with Jenne Sellinger A, MD.   Specialty:  Obstetrics and Gynecology   Why:  As scheduled   Contact information:    [address] [place] [address] [phone]        Tawnya CrookHogan, Heather Donovan 01/02/2015, 11:07 PM

## 2015-01-02 NOTE — MAU Note (Signed)
Pt reports she has only felt fetal movement x 2 today.

## 2015-01-07 ENCOUNTER — Other Ambulatory Visit (HOSPITAL_COMMUNITY): Payer: Self-pay | Admitting: Obstetrics and Gynecology

## 2015-01-07 DIAGNOSIS — O24414 Gestational diabetes mellitus in pregnancy, insulin controlled: Secondary | ICD-10-CM

## 2015-01-10 ENCOUNTER — Ambulatory Visit (HOSPITAL_COMMUNITY)
Admission: RE | Admit: 2015-01-10 | Discharge: 2015-01-10 | Disposition: A | Discharge status: Home / Self Care | Payer: Medicaid Other | Source: Ambulatory Visit | Attending: Obstetrics and Gynecology | Admitting: Obstetrics and Gynecology

## 2015-01-10 DIAGNOSIS — O24414 Gestational diabetes mellitus in pregnancy, insulin controlled: Secondary | ICD-10-CM | POA: Insufficient documentation

## 2015-01-10 DIAGNOSIS — Z3A38 38 weeks gestation of pregnancy: Secondary | ICD-10-CM | POA: Insufficient documentation

## 2015-01-10 DIAGNOSIS — O24419 Gestational diabetes mellitus in pregnancy, unspecified control: Secondary | ICD-10-CM | POA: Insufficient documentation

## 2015-01-10 DIAGNOSIS — Z794 Long term (current) use of insulin: Secondary | ICD-10-CM | POA: Insufficient documentation

## 2015-01-17 ENCOUNTER — Inpatient Hospital Stay (HOSPITAL_COMMUNITY): Payer: Medicaid Other | Admitting: Anesthesiology

## 2015-01-17 ENCOUNTER — Encounter (HOSPITAL_COMMUNITY): Payer: Self-pay | Admitting: *Deleted

## 2015-01-17 ENCOUNTER — Inpatient Hospital Stay (HOSPITAL_COMMUNITY)
Admission: AD | Admit: 2015-01-17 | Discharge: 2015-01-20 | DRG: 775 | Disposition: A | Discharge status: Home / Self Care | Payer: Medicaid Other | Source: Ambulatory Visit | Attending: Obstetrics and Gynecology | Admitting: Obstetrics and Gynecology

## 2015-01-17 DIAGNOSIS — Z79899 Other long term (current) drug therapy: Secondary | ICD-10-CM | POA: Diagnosis not present

## 2015-01-17 DIAGNOSIS — O24429 Gestational diabetes mellitus in childbirth, unspecified control: Principal | ICD-10-CM | POA: Diagnosis present

## 2015-01-17 DIAGNOSIS — Z3A39 39 weeks gestation of pregnancy: Secondary | ICD-10-CM | POA: Diagnosis present

## 2015-01-17 LAB — CBC
HCT: 34.7 % — ABNORMAL LOW (ref 36.0–46.0)
HEMOGLOBIN: 11.5 g/dL — AB (ref 12.0–15.0)
MCH: 25.4 pg — AB (ref 26.0–34.0)
MCHC: 33.1 g/dL (ref 30.0–36.0)
MCV: 76.6 fL — AB (ref 78.0–100.0)
PLATELETS: 225 10*3/uL (ref 150–400)
RBC: 4.53 MIL/uL (ref 3.87–5.11)
RDW: 17.5 % — ABNORMAL HIGH (ref 11.5–15.5)
WBC: 14.2 10*3/uL — ABNORMAL HIGH (ref 4.0–10.5)

## 2015-01-17 LAB — ABO/RH: ABO/RH(D): A POS

## 2015-01-17 LAB — GLUCOSE, RANDOM: Glucose, Bld: 92 mg/dL (ref 70–99)

## 2015-01-17 LAB — TYPE AND SCREEN
ABO/RH(D): A POS
ANTIBODY SCREEN: NEGATIVE

## 2015-01-17 MED ORDER — LACTATED RINGERS IV SOLN
INTRAVENOUS | Status: DC
  Administered 2015-01-17 – 2015-01-18 (×5): via INTRAVENOUS

## 2015-01-17 MED ORDER — LACTATED RINGERS IV SOLN
500.0000 mL | INTRAVENOUS | Status: DC | PRN
  Administered 2015-01-17 (×2): 500 mL via INTRAVENOUS

## 2015-01-17 MED ORDER — OXYTOCIN BOLUS FROM INFUSION
500.0000 mL | INTRAVENOUS | Status: DC
  Administered 2015-01-18: 500 mL via INTRAVENOUS

## 2015-01-17 MED ORDER — ACETAMINOPHEN 325 MG PO TABS: 650.0000 mg | ORAL_TABLET | ORAL | Status: DC | PRN

## 2015-01-17 MED ORDER — LACTATED RINGERS IV SOLN: 500.0000 mL | Freq: Once | INTRAVENOUS | Status: DC

## 2015-01-17 MED ORDER — PHENYLEPHRINE 40 MCG/ML (10ML) SYRINGE FOR IV PUSH (FOR BLOOD PRESSURE SUPPORT)
80.0000 ug | PREFILLED_SYRINGE | INTRAVENOUS | Status: DC | PRN
  Filled 2015-01-17: qty 20
  Filled 2015-01-17: qty 2

## 2015-01-17 MED ORDER — OXYTOCIN 40 UNITS IN LACTATED RINGERS INFUSION - SIMPLE MED
1.0000 m[IU]/min | INTRAVENOUS | Status: DC
  Administered 2015-01-17: 18 m[IU]/min via INTRAVENOUS
  Administered 2015-01-17: 2 m[IU]/min via INTRAVENOUS
  Filled 2015-01-17: qty 1000

## 2015-01-17 MED ORDER — OXYTOCIN 40 UNITS IN LACTATED RINGERS INFUSION - SIMPLE MED: 62.5000 mL/h | INTRAVENOUS | Status: DC

## 2015-01-17 MED ORDER — LIDOCAINE HCL (PF) 1 % IJ SOLN
30.0000 mL | INTRAMUSCULAR | Status: AC | PRN
  Administered 2015-01-18: 30 mL via SUBCUTANEOUS
  Filled 2015-01-17: qty 30

## 2015-01-17 MED ORDER — PHENYLEPHRINE 40 MCG/ML (10ML) SYRINGE FOR IV PUSH (FOR BLOOD PRESSURE SUPPORT)
80.0000 ug | PREFILLED_SYRINGE | INTRAVENOUS | Status: DC | PRN
  Filled 2015-01-17: qty 2

## 2015-01-17 MED ORDER — FENTANYL 2.5 MCG/ML BUPIVACAINE 1/10 % EPIDURAL INFUSION (WH - ANES)
14.0000 mL/h | INTRAMUSCULAR | Status: DC | PRN
  Administered 2015-01-17 – 2015-01-18 (×2): 14 mL/h via EPIDURAL
  Filled 2015-01-17 (×2): qty 125

## 2015-01-17 MED ORDER — FENTANYL 2.5 MCG/ML BUPIVACAINE 1/10 % EPIDURAL INFUSION (WH - ANES)
INTRAMUSCULAR | Status: DC | PRN
  Administered 2015-01-17: 14 mL/h via EPIDURAL

## 2015-01-17 MED ORDER — CITRIC ACID-SODIUM CITRATE 334-500 MG/5ML PO SOLN
30.0000 mL | ORAL | Status: DC | PRN
Start: 2015-01-17 — End: 2015-01-18

## 2015-01-17 MED ORDER — TERBUTALINE SULFATE 1 MG/ML IJ SOLN: 0.2500 mg | Freq: Once | INTRAMUSCULAR | Status: AC | PRN

## 2015-01-17 MED ORDER — LIDOCAINE HCL (PF) 1 % IJ SOLN
INTRAMUSCULAR | Status: DC | PRN
  Administered 2015-01-17 (×2): 5 mL

## 2015-01-17 MED ORDER — OXYCODONE-ACETAMINOPHEN 5-325 MG PO TABS: 1.0000 | ORAL_TABLET | ORAL | Status: DC | PRN

## 2015-01-17 MED ORDER — EPHEDRINE 5 MG/ML INJ
10.0000 mg | INTRAVENOUS | Status: DC | PRN
  Filled 2015-01-17: qty 2

## 2015-01-17 MED ORDER — DIPHENHYDRAMINE HCL 50 MG/ML IJ SOLN: 12.5000 mg | INTRAMUSCULAR | Status: DC | PRN

## 2015-01-17 MED ORDER — BUTORPHANOL TARTRATE 1 MG/ML IJ SOLN
1.0000 mg | INTRAMUSCULAR | Status: DC | PRN
  Administered 2015-01-17: 1 mg via INTRAVENOUS
  Filled 2015-01-17: qty 1

## 2015-01-17 MED ORDER — OXYCODONE-ACETAMINOPHEN 5-325 MG PO TABS: 2.0000 | ORAL_TABLET | ORAL | Status: DC | PRN

## 2015-01-17 MED ORDER — ONDANSETRON HCL 4 MG/2ML IJ SOLN: 4.0000 mg | Freq: Four times a day (QID) | INTRAMUSCULAR | Status: DC | PRN

## 2015-01-17 NOTE — H&P (Signed)
29 y.o. 8419w2d  G1P0 comes in for induction for A2GM at term.  Otherwise has good fetal movement and no bleeding.  Past Medical History  Diagnosis Date  . Cystic acne   . Ankle edema   . Irregular menstrual cycle   . Hyperlipidemia   . Gestational diabetes mellitus, class A2 2016    on glyburide    Past Surgical History  Procedure Laterality Date  . Hand surgery      Cyst on Left Thumb-Age 29  . Tooth extraction      OB History  Gravida Para Term Preterm AB SAB TAB Ectopic Multiple Living  1         0    # Outcome Date GA Lbr Len/2nd Weight Sex Delivery Anes PTL Lv  1 Current               History   Social History  . Marital Status: Single    Spouse Name: N/A  . Number of Children: 0  . Years of Education: 14   Occupational History  . CMA Other    [employer]    Social History Main Topics  . Smoking status: Never Smoker   . Smokeless tobacco: Never Used  . Alcohol Use: No  . Drug Use: No  . Sexual Activity: Yes    Birth Control/ Protection: None   Other Topics Concern  . 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    Prenatal Transfer Tool  Maternal Diabetes: Yes:  Diabetes Type:  Insulin/Medication controlled- on glyburide and controlled Genetic Screening: Normal- NIPT Maternal Ultrasounds/Referrals: Normal Fetal Ultrasounds or other Referrals:  None Maternal Substance Abuse:  No Significant Maternal Medications:  Meds include: Other: glyburide at  night Significant Maternal Lab Results: Lab values include: Other: glucose this am 92  Other PNC: uncomplicated otherwise and good glucose control; ANT was reassuring.   Filed Vitals:   01/17/15 1014  BP: 137/91  Pulse: 85  Temp:      Lungs/Cor:  NAD Abdomen:  soft, gravid Ex:  no cords, erythema SVE:  1/20/-2 per nurse FHTs:  140s, good STV, NST R Toco:  qocc   A/P   Term with A2GDM and good control- for induction.  GBS neg  Adyline Huberty A

## 2015-01-17 NOTE — Anesthesia Preprocedure Evaluation (Signed)
Anesthesia Evaluation  Patient identified by MRN, date of birth, ID band Patient awake and Patient confused    Reviewed: Allergy & Precautions, H&P , NPO status , Patient's Chart, lab work & pertinent test results  Airway Mallampati: II       Dental  (+) Teeth Intact   Pulmonary  breath sounds clear to auscultation  Pulmonary exam normal       Cardiovascular Exercise Tolerance: Good Rhythm:regular Rate:Normal     Neuro/Psych    GI/Hepatic   Endo/Other  diabetes, Well Controlled, Gestational  Renal/GU      Musculoskeletal   Abdominal Normal abdominal exam  (+)   Peds  Hematology   Anesthesia Other Findings   Reproductive/Obstetrics (+) Pregnancy                             Anesthesia Physical Anesthesia Plan  ASA: II  Anesthesia Plan: Epidural   Post-op Pain Management:    Induction:   Airway Management Planned:   Additional Equipment:   Intra-op Plan:   Post-operative Plan:   Informed Consent: I have reviewed the patients History and Physical, chart, labs and discussed the procedure including the risks, benefits and alternatives for the proposed anesthesia with the patient or authorized representative who has indicated his/her understanding and acceptance.     Plan Discussed with:   Anesthesia Plan Comments:         Anesthesia Quick Evaluation

## 2015-01-17 NOTE — Anesthesia Procedure Notes (Signed)
Epidural Patient location during procedure: OB  Staffing Anesthesiologist: Sebastian AcheMANNY, Laura Sexton Performed by: anesthesiologist   Preanesthetic Checklist Completed: patient identified, site marked, surgical consent, pre-op evaluation, timeout performed, IV checked, risks and benefits discussed and monitors and equipment checked  Epidural Patient position: sitting Prep: DuraPrep Patient monitoring: heart rate, continuous pulse ox and blood pressure Approach: midline Location: L3-L4 Injection technique: LOR air  Needle:  Needle type: Tuohy  Needle gauge: 18 G Needle length: 9 cm and 9 Needle insertion depth: 5 cm Catheter type: closed end flexible Catheter size: 20 Guage Catheter at skin depth: 13 cm Test dose: negative  Assessment Events: blood not aspirated, injection not painful, no injection resistance, negative IV test and no paresthesia  Additional Notes   Patient tolerated the insertion well without complications.Reason for block:procedure for pain

## 2015-01-18 ENCOUNTER — Encounter (HOSPITAL_COMMUNITY): Payer: Self-pay | Admitting: *Deleted

## 2015-01-18 LAB — RPR: RPR Ser Ql: NONREACTIVE

## 2015-01-18 MED ORDER — IBUPROFEN 800 MG PO TABS
800.0000 mg | ORAL_TABLET | Freq: Three times a day (TID) | ORAL | Status: DC
  Administered 2015-01-18 – 2015-01-20 (×5): 800 mg via ORAL
  Filled 2015-01-18 (×5): qty 1

## 2015-01-18 MED ORDER — TETANUS-DIPHTH-ACELL PERTUSSIS 5-2.5-18.5 LF-MCG/0.5 IM SUSP: 0.5000 mL | Freq: Once | INTRAMUSCULAR | Status: DC

## 2015-01-18 MED ORDER — METHYLERGONOVINE MALEATE 0.2 MG/ML IJ SOLN: 0.2000 mg | INTRAMUSCULAR | Status: DC | PRN

## 2015-01-18 MED ORDER — SENNOSIDES-DOCUSATE SODIUM 8.6-50 MG PO TABS
2.0000 | ORAL_TABLET | ORAL | Status: DC
  Administered 2015-01-18 – 2015-01-20 (×2): 2 via ORAL
  Filled 2015-01-18 (×2): qty 2

## 2015-01-18 MED ORDER — WITCH HAZEL-GLYCERIN EX PADS: 1.0000 "application " | MEDICATED_PAD | CUTANEOUS | Status: DC | PRN

## 2015-01-18 MED ORDER — PRENATAL MULTIVITAMIN CH
1.0000 | ORAL_TABLET | Freq: Every day | ORAL | Status: DC
  Administered 2015-01-19: 1 via ORAL
  Filled 2015-01-18: qty 1

## 2015-01-18 MED ORDER — BENZOCAINE-MENTHOL 20-0.5 % EX AERO
1.0000 "application " | INHALATION_SPRAY | CUTANEOUS | Status: DC | PRN
  Administered 2015-01-18: 1 via TOPICAL
  Filled 2015-01-18: qty 56

## 2015-01-18 MED ORDER — SODIUM CHLORIDE 0.9 % IV SOLN: 250.0000 mL | INTRAVENOUS | Status: DC | PRN

## 2015-01-18 MED ORDER — METHYLERGONOVINE MALEATE 0.2 MG PO TABS: 0.2000 mg | ORAL_TABLET | ORAL | Status: DC | PRN

## 2015-01-18 MED ORDER — OXYCODONE-ACETAMINOPHEN 5-325 MG PO TABS: 2.0000 | ORAL_TABLET | ORAL | Status: DC | PRN

## 2015-01-18 MED ORDER — MEASLES, MUMPS & RUBELLA VAC ~~LOC~~ INJ: 0.5000 mL | INJECTION | Freq: Once | SUBCUTANEOUS | Status: DC

## 2015-01-18 MED ORDER — SODIUM CHLORIDE 0.9 % IJ SOLN
3.0000 mL | INTRAMUSCULAR | Status: DC | PRN
  Administered 2015-01-18: 3 mL via INTRAVENOUS
  Filled 2015-01-18: qty 3

## 2015-01-18 MED ORDER — LABETALOL HCL 5 MG/ML IV SOLN
20.0000 mg | INTRAVENOUS | Status: DC | PRN
  Filled 2015-01-18: qty 4

## 2015-01-18 MED ORDER — ONDANSETRON HCL 4 MG/2ML IJ SOLN: 4.0000 mg | INTRAMUSCULAR | Status: DC | PRN

## 2015-01-18 MED ORDER — ONDANSETRON HCL 4 MG PO TABS: 4.0000 mg | ORAL_TABLET | ORAL | Status: DC | PRN

## 2015-01-18 MED ORDER — LANOLIN HYDROUS EX OINT: TOPICAL_OINTMENT | CUTANEOUS | Status: DC | PRN

## 2015-01-18 MED ORDER — OXYCODONE-ACETAMINOPHEN 5-325 MG PO TABS: 1.0000 | ORAL_TABLET | ORAL | Status: DC | PRN

## 2015-01-18 MED ORDER — ACETAMINOPHEN 325 MG PO TABS: 650.0000 mg | ORAL_TABLET | ORAL | Status: DC | PRN

## 2015-01-18 MED ORDER — DIPHENHYDRAMINE HCL 25 MG PO CAPS: 25.0000 mg | ORAL_CAPSULE | Freq: Four times a day (QID) | ORAL | Status: DC | PRN

## 2015-01-18 MED ORDER — SODIUM CHLORIDE 0.9 % IJ SOLN: 3.0000 mL | Freq: Two times a day (BID) | INTRAMUSCULAR | Status: DC

## 2015-01-18 MED ORDER — MAGNESIUM HYDROXIDE 400 MG/5ML PO SUSP: 30.0000 mL | ORAL | Status: DC | PRN

## 2015-01-18 MED ORDER — SIMETHICONE 80 MG PO CHEW: 80.0000 mg | CHEWABLE_TABLET | ORAL | Status: DC | PRN

## 2015-01-18 MED ORDER — LACTATED RINGERS IV SOLN
INTRAVENOUS | Status: DC
  Administered 2015-01-18: 01:00:00 via INTRAUTERINE

## 2015-01-18 MED ORDER — HYDRALAZINE HCL 20 MG/ML IJ SOLN: 10.0000 mg | Freq: Once | INTRAMUSCULAR | Status: DC | PRN

## 2015-01-18 MED ORDER — ZOLPIDEM TARTRATE 5 MG PO TABS: 5.0000 mg | ORAL_TABLET | Freq: Every evening | ORAL | Status: DC | PRN

## 2015-01-18 MED ORDER — DIBUCAINE 1 % RE OINT: 1.0000 "application " | TOPICAL_OINTMENT | RECTAL | Status: DC | PRN

## 2015-01-18 MED ORDER — FERROUS SULFATE 325 (65 FE) MG PO TABS
325.0000 mg | ORAL_TABLET | Freq: Two times a day (BID) | ORAL | Status: DC
  Administered 2015-01-19 – 2015-01-20 (×3): 325 mg via ORAL
  Filled 2015-01-18 (×3): qty 1

## 2015-01-18 MED ORDER — MISOPROSTOL 200 MCG PO TABS
ORAL_TABLET | ORAL | Status: AC
  Administered 2015-01-18: 1000 ug
  Filled 2015-01-18: qty 5

## 2015-01-18 NOTE — Progress Notes (Signed)
Amnioinfusion has corrected variables.  FHTs 130s, gSTV, NST R; no decels  Toco q4-5 with pitocin.  Cat 1 tracing now, reassuring.  Continue.

## 2015-01-18 NOTE — Progress Notes (Signed)
FHTs 140s with gSTV and NST R.  Only occ mild variables.  Toco q 5  SVE per nurse 6-7/C/-2 now  Continue.

## 2015-01-18 NOTE — Progress Notes (Signed)
FHTs 120s, gSTV and NST R; occ mild variables but overall very reassuring and Cat 1. Toco q 5  SVE per nurse 9.5/C/-2  Continue.

## 2015-01-18 NOTE — Lactation Note (Signed)
This note was copied from the chart of Laura Sexton. Lactation Consultation Note  Patient Name: Laura Clint BolderMemoona Sexton ZOXWR'UToday's Date: 01/18/2015 Reason for consult: Initial assessment attempted several times but interrupted by visitors and other care being given.  LC able to initiate DEBP at 2300 tonight.Mom is a primipara and her baby was transferred to NICU for unstable blood sugars, requiring IV fluids.  Mom has expressible colostrum and has been shown both hand and DEBP expression.  LC initiated pump at 2300, with Mom's Mother and sisters at bedside and willing to assist. Flowing drops noted.  LC also provided snappies and yellow dots to label the milk after expression and wrote the pumping guidelines on greaseboard.  LC also reviewed washing parts and assembly and use of DEBP as well as storage guidelines for both NICU and standard storage.  LC also encouraged mom to visit baby in NICU and place baby STS as much as possible.  LC encouraged review of Baby and Me pp 9, 14 and 20-25 for STS and BF information. LC provided [employer] Resource brochure and reviewed [facility] and list of community and web site resources.      Maternal Data Formula Feeding for Exclusion: Yes Reason for exclusion: Admission to Intensive Care Unit (ICU) post-partum (baby in NICU) Has patient been taught Hand Expression?: Yes (LC demonstrated prior to use of DEBP) Does the patient have breastfeeding experience prior to this delivery?: No  Feeding    LATCH Score/Interventions           initial LATCH score=7 prior to transfer to NICU           Lactation Tools Discussed/Used Pump Review: Setup, frequency, and cleaning;Milk Storage (started at 2300 (9 hours pp)) Initiated by:: Larina EarthlyLC, Nayeliz Hipp Date initiated:: 01/18/15 Hand expression, STS NICU pumping booklet and guidelines  Consult Status Consult Status: Follow-up Date: 01/19/15 Follow-up type: In-patient    Warrick ParisianBryant, Tatiyana Foucher [facility] 01/18/2015, 11:20  PM

## 2015-01-18 NOTE — Progress Notes (Signed)
CTSP  Pt has been having repetitive variables over last hour that despite rescusitative measures are getting worse.  Now variables are severe by duration- lasting upwards of a minute and many in the late presentation.  FHTs 150s, gSTV, some accels but blunted shoulders of prolonged repetitive variables 40-50 beats below baseline.  Toco q 3-5  SVE 6/C/-2  Placed IUPC and will give 30-60 minutes with amnioinfusion.  If not returned to Cat 1 tracing or closer to delivery, will do c/s.

## 2015-01-19 LAB — CBC
HCT: 25.4 % — ABNORMAL LOW (ref 36.0–46.0)
Hemoglobin: 8.5 g/dL — ABNORMAL LOW (ref 12.0–15.0)
MCH: 25.8 pg — ABNORMAL LOW (ref 26.0–34.0)
MCHC: 33.5 g/dL (ref 30.0–36.0)
MCV: 77 fL — AB (ref 78.0–100.0)
Platelets: 163 10*3/uL (ref 150–400)
RBC: 3.3 MIL/uL — ABNORMAL LOW (ref 3.87–5.11)
RDW: 17.7 % — AB (ref 11.5–15.5)
WBC: 15.8 10*3/uL — AB (ref 4.0–10.5)

## 2015-01-19 NOTE — Progress Notes (Addendum)
Patient is eating, ambulating, voiding.  Pain control is good.  Filed Vitals:   01/18/15 1645 01/18/15 1745 01/18/15 2153 01/19/15 0601  BP: 141/86 136/80 153/92 122/85  Pulse: 105 104 101 83  Temp: 98.3 F (36.8 C) 98.6 F (37 C) 98.4 F (36.9 C) 98 F (36.7 C)  TempSrc: Oral Oral Oral Oral  Resp: 18 18 20 18   Height:      Weight:      SpO2:        Fundus firm Perineum without swelling.  Lab Results  Component Value Date   WBC 15.8* 01/19/2015   HGB 8.5* 01/19/2015   HCT 25.4* 01/19/2015   MCV 77.0* 01/19/2015   PLT 163 01/19/2015    --/--/A POS, A POS (04/08 0950)/RI  A/P Post partum day 1.  Routine care.  Expect d/c routine.  Iron.  Circ in office.  Azka Steger A

## 2015-01-19 NOTE — Anesthesia Postprocedure Evaluation (Signed)
Anesthesia Post Note  Patient: [name]  Procedure(s) Performed: * No procedures listed *  Anesthesia type: Epidural  Patient location: Mother/Baby  Post pain: Pain level controlled  Post assessment: Post-op Vital signs reviewed  Last Vitals:  Filed Vitals:   01/19/15 0601  BP: 122/85  Pulse: 83  Temp: 36.7 C  Resp: 18    Post vital signs: Reviewed  Level of consciousness:alert  Complications: No apparent anesthesia complications

## 2015-01-19 NOTE — Lactation Note (Signed)
This note was copied from the chart of Laura Sexton. Lactation Consultation Note  Patient Name: Laura Clint BolderMemoona Griffiths WUJWJ'XToday's Date: 01/19/2015 Reason for consult: Follow-up assessment;NICU baby.  Mom has recently pumped and obtained about 5 ml's of colostrum.  Baby remains in NICU and mom able to hold baby when she visits and continues pumping q3h and taking ebm to NICU for baby.  Mom denies any concerns about use of pump but will call as needed.  LC encouraged her to continue pumping, with breast massage and hand expression at least q3h.   Maternal Data    Feeding Feeding Type: Breast Milk Nipple Type: Regular Length of feed: 10 min  LATCH Score/Interventions           N/A - baby remains in NICU with IV fluids (receiving ebm and formula by bottle)           Lactation Tools Discussed/Used   DEBP and breast massage/hand expression q3h  Consult Status Consult Status: Follow-up Date: 01/20/15 Follow-up type: In-patient    Warrick ParisianBryant, Nafisa Olds [facility] 01/19/2015, 9:44 PM

## 2015-01-20 NOTE — Discharge Summary (Signed)
Obstetric Discharge Summary Reason for Admission: induction of labor Prenatal Procedures: NST and ultrasound Intrapartum Procedures: spontaneous vaginal delivery Postpartum Procedures: none Complications-Operative and Postpartum: 2nd degree perineal laceration HEMOGLOBIN  Date Value Ref Range Status  01/19/2015 8.5* 12.0 - 15.0 g/dL Final    Comment:    REPEATED TO VERIFY DELTA CHECK NOTED    HCT  Date Value Ref Range Status  01/19/2015 25.4* 36.0 - 46.0 % Final    Physical Exam:  General: alert Lochia: appropriate Uterine Fundus: firm   Discharge Diagnoses: GDM  Discharge Information: Date: 01/20/2015 Activity: pelvic rest Diet: routine Medications: PNV and Ibuprofen Condition: stable Instructions: refer to practice specific booklet Discharge to: home Follow-up Information    Follow up with HORVATH,MICHELLE A, MD. Schedule an appointment as soon as possible for a visit in 1 month.   Specialty:  Obstetrics and Gynecology   Contact information:   [address] [place] [address] [phone]       Newborn Data: Live born female  Birth Weight: 6 lb 4.9 oz (2860 g) APGAR: 8, 9  Home with in NICU.  ANDERSON,MARK E 01/20/2015, 8:44 AM

## 2015-01-20 NOTE — Progress Notes (Signed)
Ur chart review completed.  

## 2015-01-20 NOTE — Progress Notes (Signed)
PPD#2 Pt is doing well. Baby in NICU with blood sugar issues  VSSAF IMP/Stable Plan/ discharge.

## 2015-01-21 ENCOUNTER — Ambulatory Visit: Payer: Self-pay

## 2015-01-21 NOTE — Lactation Note (Signed)
This note was copied from the chart of Laura Sexton. Lactation Consultation Note  Patient Name: Laura Clint BolderMemoona Vercher WUJWJ'XToday's Date: 01/21/2015 Reason for consult: Follow-up assessment;NICU baby NICU baby 75 hours of life. Called to NICU to assist with latching baby to breast. Baby sleepy at breast in cross-cradle position when [facility]. Mom able to hand express colostrum and place on baby's lips, but baby would not wake to nurse. Repositioned baby to help wake baby, but baby still would not latch. Baby gently suckled LC's gloved finger, but still not able to stimulate baby to latch. Enc mom to continue to let baby nuzzle at breast. Mom states that when she pumped last night after baby at breast for first time, she was able to pump a lot more milk afterwards. Discussed with mom the benefits of STS to the baby and mom--and to mom's milk supply. Enc mom to offer STS whenever possible, and attempts at breasts as well. Enc mom to [facility] when she returns for another attempt at latching. Mom states that pumping is going well and she has no questions/concerns at this time.   Maternal Data    Feeding Feeding Type: Breast Milk Length of feed: 60 min  LATCH Score/Interventions Latch: Too sleepy or reluctant, no latch achieved, no sucking elicited. Intervention(s): Skin to skin;Waking techniques Intervention(s): Adjust position;Assist with latch;Breast compression  Audible Swallowing: None Intervention(s): Skin to skin;Hand expression  Type of Nipple: Everted at rest and after stimulation  Comfort (Breast/Nipple): Soft / non-tender     Hold (Positioning): Assistance needed to correctly position infant at breast and maintain latch.  LATCH Score: 5  Lactation Tools Discussed/Used     Consult Status Consult Status: PRN    Laura Sexton, Laura Sexton 01/21/2015, 5:02 PM

## 2015-01-28 ENCOUNTER — Ambulatory Visit: Payer: Self-pay

## 2015-01-28 NOTE — Lactation Note (Signed)
This note was copied from the chart of Laura Sexton. Lactation Consultation Note  Patient Name: Laura Laura BolderMemoona Getty CZYSA'YToday's Date: 01/28/2015 Reason for consult: Follow-up assessment;NICU baby NICU baby 10 days of life. Mom holding baby at bedside. Mom states that pumping going well and she has a freezer full of milk. Mom states that she has no issues at this time. Enc mom to call for assistance as needed.   Maternal Data    Feeding Feeding Type: Breast Milk with Formula added Nipple Type: Dr. Levert FeinsteinBrowns Ultra Preemie Length of feed: 45 min (15 PO 30 NG)  LATCH Score/Interventions                      Lactation Tools Discussed/Used     Consult Status Consult Status: PRN    Geralynn OchsWILLIARD, Hensley Aziz 01/28/2015, 3:50 PM

## 2015-04-21 ENCOUNTER — Encounter (HOSPITAL_BASED_OUTPATIENT_CLINIC_OR_DEPARTMENT_OTHER): Payer: Self-pay | Admitting: *Deleted

## 2015-04-21 ENCOUNTER — Emergency Department (HOSPITAL_BASED_OUTPATIENT_CLINIC_OR_DEPARTMENT_OTHER)
Admission: EM | Admit: 2015-04-21 | Discharge: 2015-04-22 | Disposition: A | Discharge status: Home / Self Care | Payer: Medicaid Other | Attending: Emergency Medicine | Admitting: Emergency Medicine

## 2015-04-21 ENCOUNTER — Emergency Department (HOSPITAL_BASED_OUTPATIENT_CLINIC_OR_DEPARTMENT_OTHER): Payer: Medicaid Other

## 2015-04-21 DIAGNOSIS — R1013 Epigastric pain: Secondary | ICD-10-CM

## 2015-04-21 DIAGNOSIS — Z79899 Other long term (current) drug therapy: Secondary | ICD-10-CM | POA: Insufficient documentation

## 2015-04-21 DIAGNOSIS — Z872 Personal history of diseases of the skin and subcutaneous tissue: Secondary | ICD-10-CM | POA: Insufficient documentation

## 2015-04-21 DIAGNOSIS — Z8632 Personal history of gestational diabetes: Secondary | ICD-10-CM | POA: Insufficient documentation

## 2015-04-21 DIAGNOSIS — Z3202 Encounter for pregnancy test, result negative: Secondary | ICD-10-CM | POA: Insufficient documentation

## 2015-04-21 DIAGNOSIS — Z8639 Personal history of other endocrine, nutritional and metabolic disease: Secondary | ICD-10-CM | POA: Insufficient documentation

## 2015-04-21 DIAGNOSIS — K819 Cholecystitis, unspecified: Secondary | ICD-10-CM

## 2015-04-21 DIAGNOSIS — Z8742 Personal history of other diseases of the female genital tract: Secondary | ICD-10-CM | POA: Insufficient documentation

## 2015-04-21 LAB — CBC WITH DIFFERENTIAL/PLATELET
Basophils Absolute: 0 10*3/uL (ref 0.0–0.1)
Basophils Relative: 0 % (ref 0–1)
EOS ABS: 0.1 10*3/uL (ref 0.0–0.7)
Eosinophils Relative: 1 % (ref 0–5)
HCT: 35.5 % — ABNORMAL LOW (ref 36.0–46.0)
HEMOGLOBIN: 11.3 g/dL — AB (ref 12.0–15.0)
Lymphocytes Relative: 30 % (ref 12–46)
Lymphs Abs: 3.2 10*3/uL (ref 0.7–4.0)
MCH: 24.2 pg — ABNORMAL LOW (ref 26.0–34.0)
MCHC: 31.8 g/dL (ref 30.0–36.0)
MCV: 76.2 fL — AB (ref 78.0–100.0)
MONO ABS: 0.7 10*3/uL (ref 0.1–1.0)
Monocytes Relative: 6 % (ref 3–12)
NEUTROS PCT: 63 % (ref 43–77)
Neutro Abs: 6.9 10*3/uL (ref 1.7–7.7)
Platelets: 239 10*3/uL (ref 150–400)
RBC: 4.66 MIL/uL (ref 3.87–5.11)
RDW: 15.1 % (ref 11.5–15.5)
WBC: 10.9 10*3/uL — ABNORMAL HIGH (ref 4.0–10.5)

## 2015-04-21 LAB — COMPREHENSIVE METABOLIC PANEL
ALBUMIN: 4.5 g/dL (ref 3.5–5.0)
ALT: 132 U/L — ABNORMAL HIGH (ref 14–54)
AST: 192 U/L — ABNORMAL HIGH (ref 15–41)
Alkaline Phosphatase: 132 U/L — ABNORMAL HIGH (ref 38–126)
Anion gap: 9 (ref 5–15)
BILIRUBIN TOTAL: 0.6 mg/dL (ref 0.3–1.2)
BUN: 16 mg/dL (ref 6–20)
CALCIUM: 10 mg/dL (ref 8.9–10.3)
CHLORIDE: 103 mmol/L (ref 101–111)
CO2: 26 mmol/L (ref 22–32)
CREATININE: 0.65 mg/dL (ref 0.44–1.00)
GFR calc Af Amer: >60 mL/min (ref >60)
GFR calc non Af Amer: >60 mL/min (ref >60)
Glucose, Bld: 117 mg/dL — ABNORMAL HIGH (ref 65–99)
Potassium: 3.9 mmol/L (ref 3.5–5.1)
Sodium: 138 mmol/L (ref 135–145)
Total Protein: 8.2 g/dL — ABNORMAL HIGH (ref 6.5–8.1)

## 2015-04-21 LAB — URINALYSIS, ROUTINE W REFLEX MICROSCOPIC
Bilirubin Urine: NEGATIVE
Glucose, UA: NEGATIVE mg/dL
HGB URINE DIPSTICK: NEGATIVE
Ketones, ur: NEGATIVE mg/dL
Nitrite: NEGATIVE
PH: 6 (ref 5.0–8.0)
Protein, ur: NEGATIVE mg/dL
Specific Gravity, Urine: 1.004 — ABNORMAL LOW (ref 1.005–1.030)
Urobilinogen, UA: 0.2 mg/dL (ref 0.0–1.0)

## 2015-04-21 LAB — URINE MICROSCOPIC-ADD ON

## 2015-04-21 LAB — PREGNANCY, URINE: PREG TEST UR: NEGATIVE

## 2015-04-21 LAB — LIPASE, BLOOD: LIPASE: 48 U/L (ref 22–51)

## 2015-04-21 NOTE — ED Provider Notes (Signed)
CSN: [id number]     Arrival date & time 04/21/15  1849 History   First MD Initiated Contact with Patient 04/21/15 2016     Chief Complaint  Patient presents with  . Abdominal Pain     (Consider location/radiation/quality/duration/timing/severity/associated sxs/prior Treatment) HPI Comments: Pt reports last menses  Last April. Last bowel movement this afternoon.  Patient is a 29 y.o. female presenting with abdominal pain. The history is provided by the patient.  Abdominal Pain Pain location:  Epigastric Pain quality: sharp   Pain radiates to:  Back Pain severity:  Moderate Onset quality:  Gradual Duration:  1 week Timing:  Intermittent Progression:  Worsening Chronicity:  Recurrent Context: not previous surgeries, not recent travel, not sick contacts, not suspicious food intake and not trauma   Relieved by:  Acetaminophen Worsened by:  Nothing tried Associated symptoms: nausea and vomiting   Associated symptoms: no belching, no hematemesis, no hematochezia, no hematuria and no melena   Risk factors: no alcohol abuse, no aspirin use and no NSAID use     Past Medical History  Diagnosis Date  . Cystic acne   . Ankle edema   . Irregular menstrual cycle   . Hyperlipidemia   . Gestational diabetes mellitus, class A2 2016    on glyburide   Past Surgical History  Procedure Laterality Date  . Hand surgery      Cyst on Left Thumb-Age 14  . Tooth extraction     Family History  Problem Relation Age of Onset  . Hypertension Mother     Living  . Hyperlipidemia Mother   . Diabetes Mother   . Hyperlipidemia Father     Living  . Hearing loss Father   . Heart disease Father 94    CABG x 3  . Glaucoma Father   . Asthma Brother     Childhood, resolved  . Cancer Paternal Uncle   . Heart disease Maternal Grandmother   . Hyperlipidemia Maternal Grandmother   . Hepatitis Maternal Grandmother   . Heart disease Maternal Grandfather   . Hyperlipidemia Maternal Grandfather   .  Heart disease Paternal Grandmother   . Hyperlipidemia Paternal Grandmother   . Heart disease Paternal Grandfather   . Hyperlipidemia Paternal Grandfather   . Lymphoma Paternal Aunt   . Healthy Sister     x2   History  Substance Use Topics  . Smoking status: Never Smoker   . Smokeless tobacco: Never Used  . Alcohol Use: No   OB History    Gravida Para Term Preterm AB TAB SAB Ectopic Multiple Living   0 1     Review of Systems  Gastrointestinal: Positive for nausea, vomiting and abdominal pain. Negative for melena, hematochezia and hematemesis.  Genitourinary: Negative for hematuria.  Skin: Positive for rash.  All other systems reviewed and are negative.     Allergies  Review of patient's allergies indicates no known allergies.  Home Medications   Prior to Admission medications   Medication Sig Start Date End Date Taking? Authorizing Provider  Prenatal Vit-Fe Fumarate-FA (PRENATAL MULTIVITAMIN) TABS tablet Take 1 tablet by mouth at bedtime.    Historical Provider, MD   BP 100/57 mmHg  Pulse 82  Temp(Src) 98.1 F (36.7 C) (Oral)  Resp 16  Ht 5' 4.25" (1.632 m)  Wt 190 lb (86.183 kg)  BMI 32.36 kg/m2  SpO2 100% Physical Exam  Constitutional: She is oriented to person, place, and time. She  appears well-developed and well-nourished.  Non-toxic appearance.  HENT:  Head: Normocephalic.  Right Ear: Tympanic membrane and external ear normal.  Left Ear: Tympanic membrane and external ear normal.  Eyes: EOM and lids are normal. Pupils are equal, round, and reactive to light.  Neck: Normal range of motion. Neck supple. Carotid bruit is not present.  Cardiovascular: Normal rate, regular rhythm, normal heart sounds, intact distal pulses and normal pulses.   Pulmonary/Chest: Breath sounds normal. No respiratory distress.  Abdominal: Soft. Bowel sounds are normal. There is tenderness in the right upper quadrant and epigastric area. There is no guarding and no CVA  tenderness.    Musculoskeletal: Normal range of motion.  Lymphadenopathy:       Head (right side): No submandibular adenopathy present.       Head (left side): No submandibular adenopathy present.    She has no cervical adenopathy.  Neurological: She is alert and oriented to person, place, and time. She has normal strength. No cranial nerve deficit or sensory deficit.  Skin: Skin is warm and dry.  Psychiatric: She has a normal mood and affect. Her speech is normal.  Nursing note and vitals reviewed.   ED Course  Call placed to Gen. surgery.   Procedures (including critical care time) Labs Review Labs Reviewed - No data to display  Imaging Review No results found.   EKG Interpretation None      MDM   Vital signs are well within normal limits. Pulse oximetry is 99% on room air. Within normal limits by my interpretation.  Components of metabolic panel reveals the liver function studies including the AST elevated at 192, PLT elevated at 132, the alkaline phosphatase elevated at 132. The lipase is within normal limits at 48. The complete blood count shows the white blood cells to be elevated at 10,900. There is no shift to the left. Urine pregnancy is negative. Urine analysis is negative for acute changes.  Abdominal ultrasound reveals cholelithiasis with multiple tiny stones and sludge in the gallbladder. There is mild gallbladder distention present.  Case discussed with Gen Surg. .  Is the opinion at this time the patient should be seen by gastroenterology for additional evaluation concerning the elevated liver functions. [facility]. Prescription for Pepcid, Prilosec, and Norco given to the patient. Patient is to return to the emergency department if any changes, problems, or concerns.    Final diagnoses:  None    *I have reviewed nursing notes, vital signs, and all appropriate lab and imaging results for this patient.**    11 long  and  Ivery QualeHobson Evert Wenrich, PA-C 04/22/15 0100  Geoffery Lyonsouglas Delo, MD 04/22/15 1616

## 2015-04-21 NOTE — ED Notes (Signed)
Pt c/o epigastric pain x 1 week.

## 2015-04-22 ENCOUNTER — Other Ambulatory Visit (HOSPITAL_COMMUNITY): Payer: Self-pay | Admitting: Gastroenterology

## 2015-04-22 DIAGNOSIS — R945 Abnormal results of liver function studies: Secondary | ICD-10-CM

## 2015-04-22 DIAGNOSIS — K8020 Calculus of gallbladder without cholecystitis without obstruction: Secondary | ICD-10-CM

## 2015-04-22 DIAGNOSIS — R109 Unspecified abdominal pain: Secondary | ICD-10-CM

## 2015-04-22 DIAGNOSIS — R7989 Other specified abnormal findings of blood chemistry: Secondary | ICD-10-CM

## 2015-04-22 MED ORDER — FAMOTIDINE 20 MG PO TABS
20.0000 mg | ORAL_TABLET | Freq: Once | ORAL | Status: AC
  Administered 2015-04-22: 20 mg via ORAL
  Filled 2015-04-22: qty 1

## 2015-04-22 MED ORDER — ONDANSETRON HCL 8 MG PO TABS
4.0000 mg | ORAL_TABLET | Freq: Once | ORAL | Status: AC
Start: 2015-04-22 — End: 2015-04-22
  Administered 2015-04-22: 01:00:00 via ORAL
  Filled 2015-04-22: qty 1

## 2015-04-22 MED ORDER — FAMOTIDINE 20 MG PO TABS: 20.0000 mg | ORAL_TABLET | Freq: Two times a day (BID) | ORAL | Status: DC

## 2015-04-22 MED ORDER — HYDROCODONE-ACETAMINOPHEN 5-325 MG PO TABS
2.0000 | ORAL_TABLET | Freq: Once | ORAL | Status: AC
  Administered 2015-04-22: 2 via ORAL
  Filled 2015-04-22: qty 2

## 2015-04-22 MED ORDER — OMEPRAZOLE 20 MG PO CPDR: 20.0000 mg | DELAYED_RELEASE_CAPSULE | Freq: Every day | ORAL | Status: DC

## 2015-04-22 MED ORDER — HYDROCODONE-ACETAMINOPHEN 5-325 MG PO TABS: ORAL_TABLET | ORAL | Status: DC

## 2015-04-22 NOTE — Discharge Instructions (Signed)
Your ultrasound reveals gallstones and sludge in her gallbladder. Your liver function studies are somewhat elevated. [facility] for additional evaluation, and possible MRCP. Please return to the emergency department if any high fever, uncontrollable vomiting, intractable pain, or deterioration in your general condition. Cholelithiasis Cholelithiasis (also called gallstones) is a form of gallbladder disease. The gallbladder is a small organ that helps you digest fats. Symptoms of gallstones are:  Feeling sick to your stomach (nausea).  Throwing up (vomiting).  Belly pain.  Yellowing of the skin (jaundice).  Sudden pain. You may feel the pain for minutes to hours.  Fever.  Pain to the touch. HOME CARE  Only take medicines as told by your doctor.  Eat a low-fat diet until you see your doctor again. Eating fat can result in pain.  Follow up with your doctor as told. Attacks usually happen time after time. Surgery is usually needed for permanent treatment. GET HELP RIGHT AWAY IF:   Your pain gets worse.  Your pain is not helped by medicines.  You have a fever and lasting symptoms for more than 2-3 days.  You have a fever and your symptoms suddenly get worse.  You keep feeling sick to your stomach and throwing up. MAKE SURE YOU:   Understand these instructions.  Will watch your condition.  Will get help right away if you are not doing well or get worse. Document Released: 03/15/2008 Document Revised: 05/30/2013 Document Reviewed: 03/21/2013 [facility] Patient Information 2015 [employer] [place]. This information is not intended to replace advice given to you by your health care provider. Make sure you discuss any questions you have with your health care provider.

## 2015-04-24 ENCOUNTER — Ambulatory Visit (HOSPITAL_COMMUNITY)
Admission: RE | Admit: 2015-04-24 | Discharge: 2015-04-24 | Disposition: A | Discharge status: Home / Self Care | Payer: Medicaid Other | Source: Ambulatory Visit | Attending: Gastroenterology | Admitting: Gastroenterology

## 2015-04-24 DIAGNOSIS — R7989 Other specified abnormal findings of blood chemistry: Secondary | ICD-10-CM

## 2015-04-24 DIAGNOSIS — R109 Unspecified abdominal pain: Secondary | ICD-10-CM

## 2015-04-24 DIAGNOSIS — R160 Hepatomegaly, not elsewhere classified: Secondary | ICD-10-CM | POA: Insufficient documentation

## 2015-04-24 DIAGNOSIS — K760 Fatty (change of) liver, not elsewhere classified: Secondary | ICD-10-CM | POA: Insufficient documentation

## 2015-04-24 DIAGNOSIS — K8020 Calculus of gallbladder without cholecystitis without obstruction: Secondary | ICD-10-CM

## 2015-04-24 DIAGNOSIS — R945 Abnormal results of liver function studies: Secondary | ICD-10-CM

## 2015-05-09 ENCOUNTER — Ambulatory Visit: Payer: Self-pay | Admitting: Surgery

## 2015-05-09 NOTE — H&P (Signed)
History of Present Illness Laura Sexton. Laura Purdom MD; 05/09/2015 1:51 PM) The patient is a 29 year old female who presents for evaluation of gall stones. Referred by Dr. Odelia Sexton for evaluation of gallbladder disease PCP - Dr. Birdena Sexton  This is a 29 year old female who is 4 months postpartum and breast-feeding who presents with a few years of very intermittent epigastric abdominal pain. This tended to occur after eating greasy foods. However about 5 weeks ago she had a very severe episode that lasted for several hours. This was associated with some nausea, vomiting, and some diarrhea. Pain radiated around her right side to her back. She was subsequently evaluated with a ultrasound which showed some small stones and sludge in the gallbladder with some mild distention but no wall thickening. Liver function tests showed elevated AST ALT and alkaline phosphatase. Bilirubin was normal. She was referred to Dr. Dulce Sexton who obtained an MRCP. This showed hepatomegaly and hepatic steatosis as well as gallbladder sludge with no sign of acute cholecystitis or biliary ductal dilatation. She presents now to discuss cholecystectomy.  The patient is a Archivist.    CLINICAL DATA: Epigastric pain for 1 week, increasing today.  EXAM: ULTRASOUND ABDOMEN COMPLETE  COMPARISON: 10/31/2010  FINDINGS: Gallbladder: Gallbladder appears mildly distended. Small stones and sludge are present. No gallbladder wall thickening. Murphy's sign is negative.  Common bile duct: Diameter: 7 mm, upper limits of normal.  Liver: No focal lesion identified. Within normal limits in parenchymal echogenicity.  IVC: No abnormality visualized.  Pancreas: Limited visualization due to overlying bowel gas.  Spleen: Size and appearance within normal limits.  Right Kidney: Length: 10.1 cm. Echogenicity within normal limits. No mass or hydronephrosis visualized.  Left Kidney: Length: 11.8 cm. Echogenicity within  normal limits. No mass or hydronephrosis visualized.  Abdominal aorta: No aneurysm visualized.  Other findings: None.  IMPRESSION: Cholelithiasis with multiple tiny stones and sludge in the gallbladder. Mild gallbladder distention. Negative Murphy's sign.   Electronically Signed By: Laura Sexton M.D. On: 04/21/2015 23:27 CLINICAL DATA: Abdominal pain for years. Worsening over the last week and a half. Worse after eating.  EXAM: MRI ABDOMEN WITHOUT AND WITH CONTRAST (INCLUDING MRCP)  TECHNIQUE: Multiplanar multisequence MR imaging of the abdomen was performed both before and after the administration of intravenous contrast. Heavily T2-weighted images of the biliary and pancreatic ducts were obtained, and three-dimensional MRCP images were rendered by post processing.  CONTRAST: 20 cc MultiHance  COMPARISON: 04/21/2015 abdominal ultrasound.  FINDINGS: Lower chest: Normal heart size without pericardial or pleural effusion.  Hepatobiliary: Hepatomegaly, 20 cm craniocaudal. Mild hepatic steatosis. No focal liver lesion. No intrahepatic duct dilatation. gallbladder sludge without wall thickening or pericholecystic fluid. Common duct normal, 7 mm. No choledocholithiasis.  Pancreas: Normal, without mass or ductal dilatation.  Spleen: Normal  Adrenals/Urinary Tract: Normal adrenal glands. Normal right kidney. Lower pole left renal cyst of 5 mm. No hydronephrosis.  Stomach/Bowel: Normal stomach and abdominal small bowel loops. Colonic stool burden suggests constipation.  Vascular/Lymphatic: Normal caliber of the aorta and branch vessels. A retroaortic left renal vein. No retroperitoneal or retrocrural adenopathy. Mildly prominent portacaval nodes are likely reactive and related to steatosis.  Other: No ascites.  Musculoskeletal: No acute osseous abnormality.  IMPRESSION: 1. Hepatomegaly and hepatic steatosis. 2. Possible constipation. 3. Gallbladder  sludge without acute cholecystitis or biliary duct dilatation.   Electronically Signed By: Laura Sexton M.D. On: 04/24/2015 16:49 Other Problems Laura Sexton, Laura Sexton; 05/09/2015 9:23 AM) Asthma Back Pain Cholelithiasis Diabetes Mellitus High  blood pressure Hypercholesterolemia  Past Surgical History Laura Sexton, Laura Sexton; 05/09/2015 9:23 AM) Oral Surgery  Diagnostic Studies History Laura Sexton, Laura Sexton; 05/09/2015 9:23 AM) Colonoscopy never Mammogram never Pap Smear 1-5 years ago  Allergies Laura Sexton, Laura Sexton; 05/09/2015 9:24 AM) No Known Drug Allergies 05/09/2015  Medication History Laura Sexton, Laura Sexton; 05/09/2015 9:25 AM) Pepcid (  Tablet, Oral) Active. Hydrocodone-Acetaminophen (5-325MG  Tablet, Oral) Active. Prenatal Multivit-Min-Fe-FA (0.1MG  Tablet, Oral) Active. Medications Reconciled  Social History Laura Sexton, Laura Sexton; 05/09/2015 9:23 AM) Caffeine use Carbonated beverages, Coffee, Tea. No alcohol use No drug use Tobacco use Never smoker.  Family History Laura Sexton, Laura Sexton; 05/09/2015 9:23 AM) Arthritis Father, Mother. Depression Sister. Diabetes Mellitus Mother. Heart Disease Father. Migraine Headache Mother, Sister.  Pregnancy / Birth History Laura Sexton, Laura Sexton; 05/09/2015 9:23 AM) Age at menarche 12 years. Contraceptive History Oral contraceptives. Gravida 1 Irregular periods Maternal age 46-30 Para 1     Review of Systems Laura Sexton Laura Sexton; 05/09/2015 9:23 AM) General Present- Fatigue, Night Sweats and Weight Gain. Not Present- Appetite Loss, Chills, Fever and Weight Loss. Skin Not Present- Change in Wart/Mole, Dryness, Hives, Jaundice, Laura Lesions, Non-Healing Wounds, Rash and Ulcer. HEENT Present- Visual Disturbances and Wears glasses/contact lenses. Not Present- Earache, Hearing Loss, Hoarseness, Nose Bleed, Oral Ulcers, Ringing in the Ears, Seasonal Allergies, Sinus Pain, Sore Throat and Yellow Eyes. Respiratory Present- Snoring. Not  Present- Bloody sputum, Chronic Cough, Difficulty Breathing and Wheezing. Breast Not Present- Breast Mass, Breast Pain, Nipple Discharge and Skin Changes. Cardiovascular Present- Leg Cramps and Swelling of Extremities. Not Present- Chest Pain, Difficulty Breathing Lying Down, Palpitations, Rapid Heart Rate and Shortness of Breath. Gastrointestinal Present- Abdominal Pain, Change in Bowel Habits, Nausea and Vomiting. Not Present- Bloating, Bloody Stool, Chronic diarrhea, Constipation, Difficulty Swallowing, Excessive gas, Gets full quickly at meals, Hemorrhoids, Indigestion and Rectal Pain. Female Genitourinary Present- Urgency. Not Present- Frequency, Nocturia, Painful Urination and Pelvic Pain. Neurological Present- Headaches. Not Present- Decreased Memory, Fainting, Numbness, Seizures, Tingling, Tremor, Trouble walking and Weakness. Psychiatric Not Present- Anxiety, Bipolar, Change in Sleep Pattern, Depression, Fearful and Frequent crying. Endocrine Present- Hair Changes and Hot flashes. Not Present- Cold Intolerance, Excessive Hunger, Heat Intolerance and Laura Diabetes. Hematology Not Present- Easy Bruising, Excessive bleeding, Gland problems, HIV and Persistent Infections.  Vitals Laura Sexton Laura Sexton; 05/09/2015 9:25 AM) 05/09/2015 9:25 AM Weight: 195 lb Height: 64in Body Surface Area: 2 m Body Mass Index: 33.47 kg/m Temp.: 97.31F(Oral)  Pulse: 75 (Regular)  BP: 132/68 (Sitting, Left Arm, Standard)     Physical Exam Laura Hazard K. Antwan Bribiesca MD; 05/09/2015 1:51 PM)  The physical exam findings are as follows: Note:WDWN in NAD HEENT: EOMI, sclera anicteric Neck: No masses, no thyromegaly Lungs: CTA bilaterally; normal respiratory effort CV: Regular rate and rhythm; no murmurs Abd: +bowel sounds, soft, mildly tender in RUQ and epigastrium; no palpable masses Ext: Well-perfused; no edema Skin: Warm, dry; no sign of jaundice    Assessment & Plan Laura Hazard K. 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 The surgical procedure has been discussed with the patient. Potential risks, benefits, alternative treatments, and expected outcomes have been explained. All of the patient's questions at this time have been answered. The likelihood of reaching the patient's treatment goal is good. The patient understand the proposed surgical procedure and wishes to proceed.   Laura Sexton. Laura Skains, MD, [facility] Surgery  General/ Trauma Surgery  05/09/2015 1:51 PM

## 2015-06-10 NOTE — Pre-Procedure Instructions (Addendum)
Narcissa Olejniczak  06/10/2015      [facility] - [place], [place] - [address] [place] [address] Phone: [phone] Fax: [phone]    Your procedure is scheduled on Wednesday, June 18, 2015  Report to [facility] Admitting at 11:00 A.M.  Call this number if you have problems the morning of surgery:  [phone]   Remember:  Do not eat food or drink liquids after midnight Tuesday, June 17, 2015  Take these medicines the morning of surgery with A SIP OF WATER:famotidine (PEPCID), omeprazole (PRILOSEC), if needed:HYDROcodonen for pain  Stop taking Aspirin, vitamins and herbal medications. Do not take any NSAIDs ie: Ibuprofen, Advil, Naproxen or any medication containing Aspirin; stop now.  Do not wear jewelry, make-up or nail polish.  Do not wear lotions, powders, or perfumes.  You may not wear deodorant.  Do not shave 48 hours prior to surgery.  Men may shave face and neck.  Do not bring valuables to the hospital.  [facility] is not responsible for any belongings or valuables.  Contacts, dentures or bridgework may not be worn into surgery.  Leave your suitcase in the car.  After surgery it may be brought to your room.  For patients admitted to the hospital, discharge time will be determined by your treatment team.  Patients discharged the day of surgery will not be allowed to drive home.   Name and phone number of your driver:  Special instructions: Special Instructions:Special Instructions: [facility]  Before surgery, you can play an important role.  Because skin is not sterile, your skin needs to be as free of germs as possible.  You can reduce the number of germs on you skin by washing with CHG (chlorahexidine gluconate) soap before surgery.  CHG is an antiseptic cleaner which kills germs and bonds with the skin to continue killing germs even after  washing.  Please DO NOT use if you have an allergy to CHG or antibacterial soaps.  If your skin becomes reddened/irritated stop using the CHG and inform your nurse when you arrive at Short Stay.  Do not shave (including legs and underarms) for at least 48 hours prior to the first CHG shower.  You may shave your face.  Please follow these instructions carefully:   1.  Shower with CHG Soap the night before surgery and the morning of Surgery.  2.  If you choose to wash your hair, wash your hair first as usual with your normal shampoo.  3.  After you shampoo, rinse your hair and body thoroughly to remove the Shampoo.  4.  Use CHG as you would any other liquid soap.  You can apply chg directly  to the skin and wash gently with scrungie or a clean washcloth.  5.  Apply the CHG Soap to your body ONLY FROM THE NECK DOWN.  Do not use on open wounds or open sores.  Avoid contact with your eyes, ears, mouth and genitals (private parts).  Wash genitals (private parts) with your normal soap.  6.  Wash thoroughly, paying special attention to the area where your surgery will be performed.  7.  Thoroughly rinse your body with warm water from the neck down.  8.  DO NOT shower/wash with your normal soap after using and rinsing off the CHG Soap.  9.  Pat yourself dry with a clean  towel.            10.  Wear clean pajamas.            11.  Place clean sheets on your bed the night of your first shower and do not sleep with pets.  Day of Surgery  Do not apply any lotions/deodorants the morning of surgery.  Please wear clean clothes to the hospital/surgery center.  Please read over the following fact sheets that you were given. Pain Booklet, Coughing and Deep Breathing and Surgical Site Infection Prevention

## 2015-06-11 ENCOUNTER — Encounter (HOSPITAL_COMMUNITY)
Admission: RE | Admit: 2015-06-11 | Discharge: 2015-06-11 | Disposition: A | Discharge status: Home / Self Care | Payer: BLUE CROSS/BLUE SHIELD | Source: Ambulatory Visit | Attending: Surgery | Admitting: Surgery

## 2015-06-11 ENCOUNTER — Encounter (HOSPITAL_COMMUNITY): Payer: Self-pay

## 2015-06-11 DIAGNOSIS — Z01812 Encounter for preprocedural laboratory examination: Secondary | ICD-10-CM | POA: Diagnosis present

## 2015-06-11 DIAGNOSIS — K811 Chronic cholecystitis: Secondary | ICD-10-CM | POA: Diagnosis not present

## 2015-06-11 HISTORY — DX: Headache: R51

## 2015-06-11 HISTORY — DX: Gastro-esophageal reflux disease without esophagitis: K21.9

## 2015-06-11 HISTORY — DX: Headache, unspecified: R51.9

## 2015-06-11 LAB — CBC
HEMATOCRIT: 35.8 % — AB (ref 36.0–46.0)
HEMOGLOBIN: 11.5 g/dL — AB (ref 12.0–15.0)
MCH: 24.3 pg — ABNORMAL LOW (ref 26.0–34.0)
MCHC: 32.1 g/dL (ref 30.0–36.0)
MCV: 75.7 fL — ABNORMAL LOW (ref 78.0–100.0)
Platelets: 252 10*3/uL (ref 150–400)
RBC: 4.73 MIL/uL (ref 3.87–5.11)
RDW: 16.5 % — AB (ref 11.5–15.5)
WBC: 11.5 10*3/uL — AB (ref 4.0–10.5)

## 2015-06-11 LAB — BASIC METABOLIC PANEL
Anion gap: 8 (ref 5–15)
BUN: 13 mg/dL (ref 6–20)
CO2: 24 mmol/L (ref 22–32)
Calcium: 10.2 mg/dL (ref 8.9–10.3)
Chloride: 105 mmol/L (ref 101–111)
Creatinine, Ser: 0.62 mg/dL (ref 0.44–1.00)
GFR calc Af Amer: >60 mL/min (ref >60)
GFR calc non Af Amer: >60 mL/min (ref >60)
Glucose, Bld: 114 mg/dL — ABNORMAL HIGH (ref 65–99)
Potassium: 4.4 mmol/L (ref 3.5–5.1)
Sodium: 137 mmol/L (ref 135–145)

## 2015-06-11 LAB — HCG, SERUM, QUALITATIVE: Preg, Serum: NEGATIVE

## 2015-06-18 ENCOUNTER — Ambulatory Visit (HOSPITAL_COMMUNITY)
Admission: RE | Admit: 2015-06-18 | Discharge: 2015-06-18 | Disposition: A | Discharge status: Home / Self Care | Payer: BLUE CROSS/BLUE SHIELD | Source: Ambulatory Visit | Attending: Surgery | Admitting: Surgery

## 2015-06-18 ENCOUNTER — Ambulatory Visit (HOSPITAL_COMMUNITY): Payer: BLUE CROSS/BLUE SHIELD

## 2015-06-18 ENCOUNTER — Ambulatory Visit (HOSPITAL_COMMUNITY): Payer: BLUE CROSS/BLUE SHIELD | Admitting: Anesthesiology

## 2015-06-18 ENCOUNTER — Encounter (HOSPITAL_COMMUNITY)
Admission: RE | Disposition: A | Discharge status: Home / Self Care | Payer: Self-pay | Source: Ambulatory Visit | Attending: Surgery

## 2015-06-18 ENCOUNTER — Encounter (HOSPITAL_COMMUNITY): Payer: Self-pay | Admitting: Surgery

## 2015-06-18 DIAGNOSIS — E119 Type 2 diabetes mellitus without complications: Secondary | ICD-10-CM | POA: Diagnosis not present

## 2015-06-18 DIAGNOSIS — K819 Cholecystitis, unspecified: Secondary | ICD-10-CM

## 2015-06-18 DIAGNOSIS — K8010 Calculus of gallbladder with chronic cholecystitis without obstruction: Secondary | ICD-10-CM | POA: Diagnosis not present

## 2015-06-18 DIAGNOSIS — E780 Pure hypercholesterolemia: Secondary | ICD-10-CM | POA: Diagnosis not present

## 2015-06-18 DIAGNOSIS — J45909 Unspecified asthma, uncomplicated: Secondary | ICD-10-CM | POA: Insufficient documentation

## 2015-06-18 DIAGNOSIS — K829 Disease of gallbladder, unspecified: Secondary | ICD-10-CM | POA: Diagnosis present

## 2015-06-18 HISTORY — PX: CHOLECYSTECTOMY: SHX55

## 2015-06-18 LAB — GLUCOSE, CAPILLARY: GLUCOSE-CAPILLARY: 94 mg/dL (ref 65–99)

## 2015-06-18 SURGERY — LAPAROSCOPIC CHOLECYSTECTOMY WITH INTRAOPERATIVE CHOLANGIOGRAM
Anesthesia: General | Site: Abdomen

## 2015-06-18 MED ORDER — MIDAZOLAM HCL 2 MG/2ML IJ SOLN
INTRAMUSCULAR | Status: AC
  Filled 2015-06-18: qty 4

## 2015-06-18 MED ORDER — ONDANSETRON HCL 4 MG/2ML IJ SOLN: 4.0000 mg | Freq: Once | INTRAMUSCULAR | Status: DC | PRN

## 2015-06-18 MED ORDER — FENTANYL CITRATE (PF) 250 MCG/5ML IJ SOLN
INTRAMUSCULAR | Status: AC
  Filled 2015-06-18: qty 5

## 2015-06-18 MED ORDER — PROPOFOL 10 MG/ML IV BOLUS
INTRAVENOUS | Status: AC
  Filled 2015-06-18: qty 20

## 2015-06-18 MED ORDER — HYDROMORPHONE HCL 1 MG/ML IJ SOLN
0.2500 mg | INTRAMUSCULAR | Status: DC | PRN
  Administered 2015-06-18 (×2): 0.5 mg via INTRAVENOUS

## 2015-06-18 MED ORDER — 0.9 % SODIUM CHLORIDE (POUR BTL) OPTIME
TOPICAL | Status: DC | PRN
  Administered 2015-06-18: 1000 mL

## 2015-06-18 MED ORDER — CEFAZOLIN SODIUM-DEXTROSE 2-3 GM-% IV SOLR: 2.0000 g | INTRAVENOUS | Status: AC

## 2015-06-18 MED ORDER — OXYCODONE-ACETAMINOPHEN 5-325 MG PO TABS
1.0000 | ORAL_TABLET | ORAL | Status: DC | PRN
Start: 2015-06-18 — End: 2015-06-20

## 2015-06-18 MED ORDER — BUPIVACAINE-EPINEPHRINE 0.25% -1:200000 IJ SOLN
INTRAMUSCULAR | Status: DC | PRN
  Administered 2015-06-18: 21 mL

## 2015-06-18 MED ORDER — ROCURONIUM BROMIDE 100 MG/10ML IV SOLN
INTRAVENOUS | Status: DC | PRN
  Administered 2015-06-18: 50 mg via INTRAVENOUS

## 2015-06-18 MED ORDER — LACTATED RINGERS IV SOLN
INTRAVENOUS | Status: DC
  Administered 2015-06-18 (×2): via INTRAVENOUS

## 2015-06-18 MED ORDER — PROPOFOL 10 MG/ML IV BOLUS
INTRAVENOUS | Status: DC | PRN
Start: 2015-06-18 — End: 2015-06-18
  Administered 2015-06-18: 120 mg via INTRAVENOUS

## 2015-06-18 MED ORDER — MEPERIDINE HCL 25 MG/ML IJ SOLN: 6.2500 mg | INTRAMUSCULAR | Status: DC | PRN

## 2015-06-18 MED ORDER — BUPIVACAINE-EPINEPHRINE (PF) 0.25% -1:200000 IJ SOLN
INTRAMUSCULAR | Status: AC
  Filled 2015-06-18: qty 30

## 2015-06-18 MED ORDER — SODIUM CHLORIDE 0.9 % IR SOLN
Status: DC | PRN
  Administered 2015-06-18: 1000 mL

## 2015-06-18 MED ORDER — MIDAZOLAM HCL 5 MG/5ML IJ SOLN
INTRAMUSCULAR | Status: DC | PRN
  Administered 2015-06-18: 2 mg via INTRAVENOUS

## 2015-06-18 MED ORDER — CEFAZOLIN SODIUM-DEXTROSE 2-3 GM-% IV SOLR
INTRAVENOUS | Status: AC
  Administered 2015-06-18: 2 g via INTRAVENOUS
  Filled 2015-06-18: qty 50

## 2015-06-18 MED ORDER — ONDANSETRON HCL 4 MG/2ML IJ SOLN
4.0000 mg | INTRAMUSCULAR | Status: DC | PRN
  Filled 2015-06-18: qty 2

## 2015-06-18 MED ORDER — ONDANSETRON HCL 4 MG/2ML IJ SOLN
INTRAMUSCULAR | Status: DC | PRN
  Administered 2015-06-18: 4 mg via INTRAVENOUS

## 2015-06-18 MED ORDER — SODIUM CHLORIDE 0.9 % IV SOLN
INTRAVENOUS | Status: DC | PRN
  Administered 2015-06-18: 12 mL

## 2015-06-18 MED ORDER — GLYCOPYRROLATE 0.2 MG/ML IJ SOLN
INTRAMUSCULAR | Status: DC | PRN
  Administered 2015-06-18: 0.2 mg via INTRAVENOUS

## 2015-06-18 MED ORDER — FENTANYL CITRATE (PF) 100 MCG/2ML IJ SOLN
INTRAMUSCULAR | Status: DC | PRN
  Administered 2015-06-18: 150 ug via INTRAVENOUS
  Administered 2015-06-18: 50 ug via INTRAVENOUS

## 2015-06-18 MED ORDER — OXYCODONE-ACETAMINOPHEN 5-325 MG PO TABS: 1.0000 | ORAL_TABLET | ORAL | Status: DC | PRN

## 2015-06-18 MED ORDER — CHLORHEXIDINE GLUCONATE 4 % EX LIQD: 1.0000 "application " | Freq: Once | CUTANEOUS | Status: DC

## 2015-06-18 MED ORDER — MORPHINE SULFATE (PF) 2 MG/ML IV SOLN: 2.0000 mg | INTRAVENOUS | Status: DC | PRN

## 2015-06-18 MED ORDER — LIDOCAINE HCL (CARDIAC) 20 MG/ML IV SOLN
INTRAVENOUS | Status: DC | PRN
  Administered 2015-06-18: 100 mg via INTRAVENOUS

## 2015-06-18 MED ORDER — SUGAMMADEX SODIUM 500 MG/5ML IV SOLN
INTRAVENOUS | Status: DC | PRN
  Administered 2015-06-18: 200 mg via INTRAVENOUS

## 2015-06-18 MED ORDER — HYDROMORPHONE HCL 1 MG/ML IJ SOLN
INTRAMUSCULAR | Status: AC
  Filled 2015-06-18: qty 1

## 2015-06-18 MED ORDER — PHENYLEPHRINE HCL 10 MG/ML IJ SOLN
INTRAMUSCULAR | Status: DC | PRN
  Administered 2015-06-18: 40 ug via INTRAVENOUS

## 2015-06-18 SURGICAL SUPPLY — 45 items
APPLIER CLIP ROT 10 11.4 M/L (STAPLE) ×2
BENZOIN TINCTURE PRP APPL 2/3 (GAUZE/BANDAGES/DRESSINGS) ×2 IMPLANT
BLADE SURG ROTATE 9660 (MISCELLANEOUS) IMPLANT
CANISTER SUCTION 2500CC (MISCELLANEOUS) ×2 IMPLANT
CHLORAPREP W/TINT 26ML (MISCELLANEOUS) ×2 IMPLANT
CLIP APPLIE ROT 10 11.4 M/L (STAPLE) ×1 IMPLANT
COVER MAYO STAND STRL (DRAPES) ×2 IMPLANT
COVER SURGICAL LIGHT HANDLE (MISCELLANEOUS) ×2 IMPLANT
DRAPE C-ARM 42X72 X-RAY (DRAPES) ×2 IMPLANT
DRSG TEGADERM 2-3/8X2-3/4 SM (GAUZE/BANDAGES/DRESSINGS) ×6 IMPLANT
DRSG TEGADERM 4X4.75 (GAUZE/BANDAGES/DRESSINGS) ×2 IMPLANT
ELECT REM PT RETURN 9FT ADLT (ELECTROSURGICAL) ×2
ELECTRODE REM PT RTRN 9FT ADLT (ELECTROSURGICAL) ×1 IMPLANT
FILTER SMOKE EVAC LAPAROSHD (FILTER) ×2 IMPLANT
GAUZE SPONGE 2X2 8PLY STRL LF (GAUZE/BANDAGES/DRESSINGS) ×1 IMPLANT
GLOVE BIO SURGEON STRL SZ7 (GLOVE) ×2 IMPLANT
GLOVE BIO SURGEON STRL SZ7.5 (GLOVE) ×2 IMPLANT
GLOVE BIOGEL PI IND STRL 7.5 (GLOVE) ×4 IMPLANT
GLOVE BIOGEL PI INDICATOR 7.5 (GLOVE) ×4
GLOVE SURG SS PI 7.0 STRL IVOR (GLOVE) ×2 IMPLANT
GLOVE SURG SS PI 7.5 STRL IVOR (GLOVE) ×2 IMPLANT
GOWN STRL REUS W/ TWL LRG LVL3 (GOWN DISPOSABLE) ×2 IMPLANT
GOWN STRL REUS W/ TWL XL LVL3 (GOWN DISPOSABLE) ×2 IMPLANT
GOWN STRL REUS W/TWL LRG LVL3 (GOWN DISPOSABLE) ×2
GOWN STRL REUS W/TWL XL LVL3 (GOWN DISPOSABLE) ×2
KIT BASIN OR (CUSTOM PROCEDURE TRAY) ×2 IMPLANT
KIT ROOM TURNOVER OR (KITS) ×2 IMPLANT
NS IRRIG 1000ML POUR BTL (IV SOLUTION) ×2 IMPLANT
PAD ARMBOARD 7.5X6 YLW CONV (MISCELLANEOUS) ×2 IMPLANT
POUCH SPECIMEN RETRIEVAL 10MM (ENDOMECHANICALS) ×2 IMPLANT
SCISSORS LAP 5X35 DISP (ENDOMECHANICALS) ×2 IMPLANT
SET CHOLANGIOGRAPH 5 50 .035 (SET/KITS/TRAYS/PACK) ×2 IMPLANT
SET IRRIG TUBING LAPAROSCOPIC (IRRIGATION / IRRIGATOR) ×2 IMPLANT
SLEEVE ENDOPATH XCEL 5M (ENDOMECHANICALS) ×2 IMPLANT
SPECIMEN JAR SMALL (MISCELLANEOUS) ×2 IMPLANT
SPONGE GAUZE 2X2 STER 10/PKG (GAUZE/BANDAGES/DRESSINGS) ×1
STRIP CLOSURE SKIN 1/2X4 (GAUZE/BANDAGES/DRESSINGS) ×2 IMPLANT
SUT MNCRL AB 4-0 PS2 18 (SUTURE) ×2 IMPLANT
TOWEL OR 17X24 6PK STRL BLUE (TOWEL DISPOSABLE) ×2 IMPLANT
TOWEL OR 17X26 10 PK STRL BLUE (TOWEL DISPOSABLE) ×2 IMPLANT
TRAY LAPAROSCOPIC MC (CUSTOM PROCEDURE TRAY) ×2 IMPLANT
TROCAR XCEL BLUNT TIP 100MML (ENDOMECHANICALS) ×2 IMPLANT
TROCAR XCEL NON-BLD 11X100MML (ENDOMECHANICALS) ×2 IMPLANT
TROCAR XCEL NON-BLD 5MMX100MML (ENDOMECHANICALS) ×2 IMPLANT
TUBING INSUFFLATION (TUBING) ×2 IMPLANT

## 2015-06-18 NOTE — Transfer of Care (Signed)
Immediate Anesthesia Transfer of Care Note  Patient: Genesis Clavel  Procedure(s) Performed: Procedure(s): LAPAROSCOPIC CHOLECYSTECTOMY WITH INTRAOPERATIVE CHOLANGIOGRAM (N/A)  Patient Location: PACU  Anesthesia Type:General  Level of Consciousness: awake, alert  and oriented  Airway & Oxygen Therapy: Patient Spontanous Breathing and Patient connected to nasal cannula oxygen  Post-op Assessment: Report given to RN, Post -op Vital signs reviewed and stable, Patient moving all extremities and Patient moving all extremities X 4  Post vital signs: Reviewed and stable  Last Vitals:  Filed Vitals:   06/18/15 1116  BP: 117/69  Pulse: 88  Temp: 36.7 C  Resp: 18    Complications: No apparent anesthesia complications

## 2015-06-18 NOTE — Discharge Instructions (Signed)
[facility] °LAPAROSCOPIC SURGERY: POST OP INSTRUCTIONS °Always review your discharge instruction sheet given to you by the facility where your surgery was performed. °IF YOU HAVE DISABILITY OR FAMILY LEAVE FORMS, YOU MUST BRING THEM TO THE OFFICE FOR PROCESSING.   °DO NOT GIVE THEM TO YOUR DOCTOR. ° °1. A prescription for pain medication will be given to you upon discharge.  Take your pain medication as prescribed, if needed.  If narcotic pain medicine is not needed, then you may take acetaminophen (Tylenol) or ibuprofen (Advil) as needed. °2. Take your usually prescribed medications unless otherwise directed. °3. If you need a refill on your pain medication, please contact your pharmacy.  They will contact our office to request authorization. Prescriptions will not be filled after 5pm or on week-ends. °4. You should follow a light diet the first few days after arrival home, such as soup and crackers, etc.  Be sure to include lots of fluids daily. °5. Most patients will experience some swelling and bruising in the area of the incisions.  Ice packs will help.  Swelling and bruising can take several days to resolve.  °6. It is common to experience some constipation if taking pain medication after surgery.  Increasing fluid intake and taking a stool softener (such as Colace) will usually help or prevent this problem from occurring.  A mild laxative (Milk of Magnesia or Miralax) should be taken according to package instructions if there are no bowel movements after 48 hours. °7. Unless discharge instructions indicate otherwise, you may remove your bandages 48 hours after surgery, and you may shower at that time.  You will have steri-strips (small skin tapes) in place directly over the incision.  These strips should be left on the skin for 7-10 days.  If your surgeon used skin glue on the incision, you may shower in 24 hours.  The glue will flake off over the next 2-3 weeks.  Any sutures or staples  will be removed at the office during your follow-up visit. °8. ACTIVITIES:  You may resume regular (light) daily activities beginning the next day--such as daily self-care, walking, climbing stairs--gradually increasing activities as tolerated.  You may have sexual intercourse when it is comfortable.  Refrain from any heavy lifting or straining until approved by your doctor. °a. You may drive when you are no longer taking prescription pain medication, you can comfortably wear a seatbelt, and you can safely maneuver your car and apply brakes. °b. RETURN TO WORK:   2-3 weeks °9. You should see your doctor in the office for a follow-up appointment approximately 2-3 weeks after your surgery.  Make sure that you call for this appointment within a day or two after you arrive home to insure a convenient appointment time. °10. OTHER INSTRUCTIONS: ________________________________________________________________________ °WHEN TO CALL YOUR DOCTOR: °1. Fever over 101.0 °2. Inability to urinate °3. Continued bleeding from incision. °4. Increased pain, redness, or drainage from the incision. °5. Increasing abdominal pain ° °The clinic staff is available to answer your questions during regular business hours.  Please don’t hesitate to call and ask to speak to one of the nurses for clinical concerns.  If you have a medical emergency, go to the nearest emergency room or call 911.  A surgeon from [facility] is always on call at the hospital. °[address], [address], [place], [place]  [address] ? [address] [address], [place], [place]   [address] °[phone] ? [phone] ? FAX [phone] °Web site:   [email or web address] ° °

## 2015-06-18 NOTE — H&P (Signed)
History of Present Illness The patient is a 29 year old female who presents for evaluation of gall stones. Referred by Dr. Odelia Gage for evaluation of gallbladder disease PCP - Dr. Birdena Jubilee  This is a 29 year old female who is 4 months postpartum and breast-feeding who presents with a few years of very intermittent epigastric abdominal pain. This tended to occur after eating greasy foods. However about 5 weeks ago she had a very severe episode that lasted for several hours. This was associated with some nausea, vomiting, and some diarrhea. Pain radiated around her right side to her back. She was subsequently evaluated with a ultrasound which showed some small stones and sludge in the gallbladder with some mild distention but no wall thickening. Liver function tests showed elevated AST ALT and alkaline phosphatase. Bilirubin was normal. She was referred to Dr. Dulce Sellar who obtained an MRCP. This showed hepatomegaly and hepatic steatosis as well as gallbladder sludge with no sign of acute cholecystitis or biliary ductal dilatation. She presents now to discuss cholecystectomy.  The patient is a Archivist.    CLINICAL DATA: Epigastric pain for 1 week, increasing today.  EXAM: ULTRASOUND ABDOMEN COMPLETE  COMPARISON: 10/31/2010  FINDINGS: Gallbladder: Gallbladder appears mildly distended. Small stones and sludge are present. No gallbladder wall thickening. Murphy's sign is negative.  Common bile duct: Diameter: 7 mm, upper limits of normal.  Liver: No focal lesion identified. Within normal limits in parenchymal echogenicity.  IVC: No abnormality visualized.  Pancreas: Limited visualization due to overlying bowel gas.  Spleen: Size and appearance within normal limits.  Right Kidney: Length: 10.1 cm. Echogenicity within normal limits. No mass or hydronephrosis visualized.  Left Kidney: Length: 11.8 cm. Echogenicity within normal limits. No mass or hydronephrosis  visualized.  Abdominal aorta: No aneurysm visualized.  Other findings: None.  IMPRESSION: Cholelithiasis with multiple tiny stones and sludge in the gallbladder. Mild gallbladder distention. Negative Murphy's sign.   Electronically Signed By: Burman Nieves M.D. On: 04/21/2015 23:27 CLINICAL DATA: Abdominal pain for years. Worsening over the last week and a half. Worse after eating.  EXAM: MRI ABDOMEN WITHOUT AND WITH CONTRAST (INCLUDING MRCP)  TECHNIQUE: Multiplanar multisequence MR imaging of the abdomen was performed both before and after the administration of intravenous contrast. Heavily T2-weighted images of the biliary and pancreatic ducts were obtained, and three-dimensional MRCP images were rendered by post processing.  CONTRAST: 20 cc MultiHance  COMPARISON: 04/21/2015 abdominal ultrasound.  FINDINGS: Lower chest: Normal heart size without pericardial or pleural effusion.  Hepatobiliary: Hepatomegaly, 20 cm craniocaudal. Mild hepatic steatosis. No focal liver lesion. No intrahepatic duct dilatation. gallbladder sludge without wall thickening or pericholecystic fluid. Common duct normal, 7 mm. No choledocholithiasis.  Pancreas: Normal, without mass or ductal dilatation.  Spleen: Normal  Adrenals/Urinary Tract: Normal adrenal glands. Normal right kidney. Lower pole left renal cyst of 5 mm. No hydronephrosis.  Stomach/Bowel: Normal stomach and abdominal small bowel loops. Colonic stool burden suggests constipation.  Vascular/Lymphatic: Normal caliber of the aorta and branch vessels. A retroaortic left renal vein. No retroperitoneal or retrocrural adenopathy. Mildly prominent portacaval nodes are likely reactive and related to steatosis.  Other: No ascites.  Musculoskeletal: No acute osseous abnormality.  IMPRESSION: 1. Hepatomegaly and hepatic steatosis. 2. Possible constipation. 3. Gallbladder sludge without acute cholecystitis or biliary  duct dilatation.   Electronically Signed By: Jeronimo Greaves M.D. On: 04/24/2015 16:49 Other Problems  Asthma Back Pain Cholelithiasis Diabetes Mellitus High blood pressure Hypercholesterolemia  Past Surgical History  Oral Surgery  Diagnostic  Studies History Colonoscopy never Mammogram never Pap Smear 1-5 years ago  Allergies  No Known Drug Allergies 05/09/2015  Medication History  Pepcid (20MG  Tablet, Oral) Active. Hydrocodone-Acetaminophen (5-325MG  Tablet, Oral) Active. Prenatal Multivit-Min-Fe-FA (0.1MG  Tablet, Oral) Active. Medications Reconciled  Social History  Caffeine use Carbonated beverages, Coffee, Tea. No alcohol use No drug use Tobacco use Never smoker.  Family History Arthritis Father, Mother. Depression Sister. Diabetes Mellitus Mother. Heart Disease Father. Migraine Headache Mother, Sister.  Pregnancy / Birth History  Age at menarche 12 years. Contraceptive History Oral contraceptives. Gravida 1 Irregular periods Maternal age 17-30 Para 1     Review of Systems  General Present- Fatigue, Night Sweats and Weight Gain. Not Present- Appetite Loss, Chills, Fever and Weight Loss. Skin Not Present- Change in Wart/Mole, Dryness, Hives, Jaundice, New Lesions, Non-Healing Wounds, Rash and Ulcer. HEENT Present- Visual Disturbances and Wears glasses/contact lenses. Not Present- Earache, Hearing Loss, Hoarseness, Nose Bleed, Oral Ulcers, Ringing in the Ears, Seasonal Allergies, Sinus Pain, Sore Throat and Yellow Eyes. Respiratory Present- Snoring. Not Present- Bloody sputum, Chronic Cough, Difficulty Breathing and Wheezing. Breast Not Present- Breast Mass, Breast Pain, Nipple Discharge and Skin Changes. Cardiovascular Present- Leg Cramps and Swelling of Extremities. Not Present- Chest Pain, Difficulty Breathing Lying Down, Palpitations, Rapid Heart Rate and Shortness of Breath. Gastrointestinal Present- Abdominal Pain,  Change in Bowel Habits, Nausea and Vomiting. Not Present- Bloating, Bloody Stool, Chronic diarrhea, Constipation, Difficulty Swallowing, Excessive gas, Gets full quickly at meals, Hemorrhoids, Indigestion and Rectal Pain. Female Genitourinary Present- Urgency. Not Present- Frequency, Nocturia, Painful Urination and Pelvic Pain. Neurological Present- Headaches. Not Present- Decreased Memory, Fainting, Numbness, Seizures, Tingling, Tremor, Trouble walking and Weakness. Psychiatric Not Present- Anxiety, Bipolar, Change in Sleep Pattern, Depression, Fearful and Frequent crying. Endocrine Present- Hair Changes and Hot flashes. Not Present- Cold Intolerance, Excessive Hunger, Heat Intolerance and New Diabetes. Hematology Not Present- Easy Bruising, Excessive bleeding, Gland problems, HIV and Persistent Infections.  Vitals  Weight: 195 lb Height: 64in Body Surface Area: 2 m Body Mass Index: 33.47 kg/m Temp.: 97.49F(Oral)  Pulse: 75 (Regular)  BP: 132/68 (Sitting, Left Arm, Standard)     Physical Exam  The physical exam findings are as follows: Note:WDWN in NAD HEENT: EOMI, sclera anicteric Neck: No masses, no thyromegaly Lungs: CTA bilaterally; normal respiratory effort CV: Regular rate and rhythm; no murmurs Abd: +bowel sounds, soft, mildly tender in RUQ and epigastrium; no palpable masses Ext: Well-perfused; no edema Skin: Warm, dry; no sign of jaundice    Assessment & Plan   CHRONIC CHOLECYSTITIS WITHOUT CALCULUS (575.11  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 The surgical procedure has been discussed with the patient. Potential risks, benefits, alternative treatments, and expected outcomes have been explained. All of the patient's questions at this time have been answered. The likelihood of reaching the patient's treatment goal is good. The patient understand the proposed surgical procedure and  wishes to proceed.   Wilmon Arms. Corliss Skains, MD, [facility]  General/ Trauma Surgery  06/18/2015 6:57 AM

## 2015-06-18 NOTE — Op Note (Signed)
Laparoscopic Cholecystectomy with IOC Procedure Note  Indications: This patient presents with symptomatic gallbladder disease and will undergo laparoscopic cholecystectomy.  Pre-operative Diagnosis: Calculus of gallbladder with other cholecystitis, without mention of obstruction  Post-operative Diagnosis: Same  Surgeon: Yarelis Ambrosino K.   Assistants: None  Anesthesia: General endotracheal anesthesia  ASA Class: 1  Procedure Details  The patient was seen again in the Holding Room. The risks, benefits, complications, treatment options, and expected outcomes were discussed with the patient. The possibilities of reaction to medication, pulmonary aspiration, perforation of viscus, bleeding, recurrent infection, finding a normal gallbladder, the need for additional procedures, failure to diagnose a condition, the possible need to convert to an open procedure, and creating a complication requiring transfusion or operation were discussed with the patient. The likelihood of improving the patient's symptoms with return to their baseline status is good.  The patient and/or family concurred with the proposed plan, giving informed consent. The site of surgery properly noted. The patient was taken to Operating Room, identified as [facility] and the procedure verified as Laparoscopic Cholecystectomy with Intraoperative Cholangiogram. A Time Out was held and the above information confirmed.  Prior to the induction of general anesthesia, antibiotic prophylaxis was administered. General endotracheal anesthesia was then administered and tolerated well. After the induction, the abdomen was prepped with Chloraprep and draped in the sterile fashion. The patient was positioned in the supine position.  Local anesthetic agent was injected into the skin below the umbilicus and an incision made. We dissected down to the abdominal fascia with blunt dissection.  The fascia was incised vertically and we entered the  peritoneal cavity bluntly.  A pursestring suture of 0-Vicryl was placed around the fascial opening.  The Hasson cannula was inserted and secured with the stay suture.  Pneumoperitoneum was then created with CO2 and tolerated well without any adverse changes in the patient's vital signs. An 11-mm port was placed in the subxiphoid position.  Two 5-mm ports were placed in the right upper quadrant. All skin incisions were infiltrated with a local anesthetic agent before making the incision and placing the trocars.   We positioned the patient in reverse Trendelenburg, tilted slightly to the patient's left.  The gallbladder was identified, the fundus grasped and retracted cephalad. There were significant omental adhesions to the gallbladder.  Adhesions were lysed bluntly and with the electrocautery where indicated, taking care not to injure any adjacent organs or viscus. The infundibulum was grasped and retracted laterally, exposing the peritoneum overlying the triangle of Calot. This was then divided and exposed in a blunt fashion. A critical view of the cystic duct and cystic artery was obtained.  The cystic duct was clearly identified and bluntly dissected circumferentially. The cystic duct was ligated with a clip distally.   An incision was made in the cystic duct and the [facility] cholangiogram catheter introduced. The catheter was secured using a clip. A cholangiogram was then obtained which showed good visualization of the distal and proximal biliary tree with no sign of filling defects or obstruction.  Contrast flowed easily into the duodenum. The catheter was then removed.   The cystic duct was then ligated with clips and divided. The cystic artery was identified, dissected free, ligated with clips and divided as well.   The gallbladder was dissected from the liver bed in retrograde fashion with the electrocautery. The gallbladder was removed and placed in an Endocatch sac. The liver bed was irrigated and  inspected. Hemostasis was achieved with the electrocautery. Copious irrigation  was utilized and was repeatedly aspirated until clear.  The gallbladder and Endocatch sac were then removed through the umbilical port site.  The pursestring suture was used to close the umbilical fascia.    We again inspected the right upper quadrant for hemostasis.  Pneumoperitoneum was released as we removed the trocars.  4-0 Monocryl was used to close the skin.   Benzoin, steri-strips, and clean dressings were applied. The patient was then extubated and brought to the recovery room in stable condition. Instrument, sponge, and needle counts were correct at closure and at the conclusion of the case.   Findings: Cholecystitis with Cholelithiasis  Estimated Blood Loss: Minimal         Drains: none         Specimens: Gallbladder           Complications: None; patient tolerated the procedure well.         Disposition: PACU - hemodynamically stable.         Condition: stable  Wilmon Arms. Corliss Skains, MD, [facility]  General/ Trauma Surgery  06/18/2015 1:19 PM

## 2015-06-18 NOTE — Anesthesia Procedure Notes (Signed)
Procedure Name: Intubation Date/Time: 06/18/2015 12:20 PM Performed by: Adonis Housekeeper Pre-anesthesia Checklist: Patient identified, Emergency Drugs available, Suction available, Patient being monitored and Timeout performed Patient Re-evaluated:Patient Re-evaluated prior to inductionOxygen Delivery Method: Circle system utilized Preoxygenation: Pre-oxygenation with 100% oxygen Intubation Type: IV induction Ventilation: Mask ventilation without difficulty and Oral airway inserted - appropriate to patient size Laryngoscope Size: Mac and 3 Grade View: Grade I Tube type: Oral Tube size: 7.0 mm Airway Equipment and Method: Stylet Placement Confirmation: ETT inserted through vocal cords under direct vision,  positive ETCO2,  CO2 detector and breath sounds checked- equal and bilateral Secured at: 22 cm Tube secured with: Tape Dental Injury: Teeth and Oropharynx as per pre-operative assessment  Difficulty Due To: Difficulty was unanticipated

## 2015-06-18 NOTE — Anesthesia Postprocedure Evaluation (Signed)
Anesthesia Post Note  Patient: Laura Sexton  Procedure(s) Performed: Procedure(s) (LRB): LAPAROSCOPIC CHOLECYSTECTOMY WITH INTRAOPERATIVE CHOLANGIOGRAM (N/A)  Anesthesia type: general  Patient location: PACU  Post pain: Pain level controlled  Post assessment: Patient's Cardiovascular Status Stable  Last Vitals:  Filed Vitals:   06/18/15 1500  BP:   Pulse: 85  Temp: 36.4 C  Resp: 18    Post vital signs: Reviewed and stable  Level of consciousness: sedated  Complications: No apparent anesthesia complications

## 2015-06-18 NOTE — Anesthesia Preprocedure Evaluation (Signed)
Anesthesia Evaluation  Patient identified by MRN, date of birth, ID band Patient awake    Reviewed: Allergy & Precautions, NPO status , Patient's Chart, lab work & pertinent test results  Airway Mallampati: I  TM Distance: >3 FB Neck ROM: Full    Dental   Pulmonary    Pulmonary exam normal        Cardiovascular Normal cardiovascular exam     Neuro/Psych    GI/Hepatic   Endo/Other  diabetes  Renal/GU      Musculoskeletal   Abdominal   Peds  Hematology   Anesthesia Other Findings   Reproductive/Obstetrics                           Anesthesia Physical Anesthesia Plan  ASA: II  Anesthesia Plan: General   Post-op Pain Management:    Induction: Intravenous  Airway Management Planned: Oral ETT  Additional Equipment:   Intra-op Plan:   Post-operative Plan: Extubation in OR  Informed Consent: I have reviewed the patients History and Physical, chart, labs and discussed the procedure including the risks, benefits and alternatives for the proposed anesthesia with the patient or authorized representative who has indicated his/her understanding and acceptance.     Plan Discussed with: CRNA and Surgeon  Anesthesia Plan Comments:         Anesthesia Quick Evaluation  

## 2015-06-19 ENCOUNTER — Encounter (HOSPITAL_COMMUNITY): Payer: Self-pay | Admitting: Surgery

## 2017-10-11 NOTE — L&D Delivery Note (Signed)
Delivery Note Patient pushed well for 35 minutes. At 5:01 AM a viable female was delivered via Vaginal, Spontaneous (Presentation: Occiput anterior  ).  APGAR: 6, 9; weight 3810 gm (8lb 6.4lz) .   Placenta status: spontaneous  In tact,   Cord:3V  with the following complications: None.  Cord pH: n/a  Following delivery, she had some mild atony of the lower uterine segment that was resolved with uterine sweep and removal of blood clot.  She continued to have a continuous flow of bleeding that was slightly more than expected.  It was resolved with catheterization of the bladder and 0.2mg  IM methergine x 1 dose.    Anesthesia:  Epidural Episiotomy: None Lacerations: 2nd degree Suture Repair: 2.0 vicryl rapide Est. Blood Loss (mL):  724 mL  Mom to postpartum.  Baby to Couplet care / Skin to Skin.  Laura Sexton GEFFEL Laura Sexton 08/12/2018, 5:30 AM

## 2018-02-08 LAB — OB RESULTS CONSOLE HIV ANTIBODY (ROUTINE TESTING): HIV: NONREACTIVE

## 2018-02-08 LAB — OB RESULTS CONSOLE RPR: RPR: NONREACTIVE

## 2018-02-08 LAB — OB RESULTS CONSOLE GC/CHLAMYDIA
Chlamydia: NEGATIVE
Gonorrhea: NEGATIVE

## 2018-02-08 LAB — OB RESULTS CONSOLE ABO/RH: RH Type: POSITIVE

## 2018-02-08 LAB — OB RESULTS CONSOLE ANTIBODY SCREEN: Antibody Screen: NEGATIVE

## 2018-02-08 LAB — OB RESULTS CONSOLE HEPATITIS B SURFACE ANTIGEN: HEP B S AG: NEGATIVE

## 2018-02-08 LAB — OB RESULTS CONSOLE RUBELLA ANTIBODY, IGM: Rubella: IMMUNE

## 2018-03-15 ENCOUNTER — Encounter: Payer: Self-pay | Admitting: Registered"

## 2018-03-15 ENCOUNTER — Encounter: Payer: BLUE CROSS/BLUE SHIELD | Attending: Obstetrics and Gynecology | Admitting: Registered"

## 2018-03-15 DIAGNOSIS — O24419 Gestational diabetes mellitus in pregnancy, unspecified control: Secondary | ICD-10-CM | POA: Diagnosis present

## 2018-03-15 DIAGNOSIS — Z713 Dietary counseling and surveillance: Secondary | ICD-10-CM | POA: Diagnosis not present

## 2018-03-15 DIAGNOSIS — O99810 Abnormal glucose complicating pregnancy: Secondary | ICD-10-CM

## 2018-03-15 NOTE — Progress Notes (Signed)
Patient was seen on 03/15/2018 for Gestational Diabetes self-management class at [facility]. The following learning objectives were met by the patient during this course:   States the definition of Gestational Diabetes  States why dietary management is important in controlling blood glucose  Describes the effects each nutrient has on blood glucose levels  Demonstrates ability to create a balanced meal plan  Demonstrates carbohydrate counting   States when to check blood glucose levels  Demonstrates proper blood glucose monitoring techniques  States the effect of stress and exercise on blood glucose levels  States the importance of limiting caffeine and abstaining from alcohol and smoking  Blood glucose monitor given: Accu-chek Guide Lot # [id number] Exp: 02-18-19 Blood glucose reading: 132  Patient instructed to monitor glucose levels: FBS: 60 - <95; 1 hour: <140; 2 hour: <120  Patient received handouts:  Nutrition Diabetes and Pregnancy, including carb counting list  Patient will be seen for follow-up as needed.

## 2018-06-19 ENCOUNTER — Inpatient Hospital Stay (HOSPITAL_COMMUNITY)
Admission: AD | Admit: 2018-06-19 | Discharge: 2018-06-19 | Disposition: A | Discharge status: Home / Self Care | Payer: Medicaid Other | Source: Ambulatory Visit | Attending: Obstetrics | Admitting: Obstetrics

## 2018-06-19 ENCOUNTER — Encounter (HOSPITAL_COMMUNITY): Payer: Self-pay | Admitting: *Deleted

## 2018-06-19 DIAGNOSIS — O26893 Other specified pregnancy related conditions, third trimester: Secondary | ICD-10-CM

## 2018-06-19 DIAGNOSIS — K2100 Gastro-esophageal reflux disease with esophagitis, without bleeding: Secondary | ICD-10-CM

## 2018-06-19 DIAGNOSIS — R12 Heartburn: Secondary | ICD-10-CM | POA: Diagnosis not present

## 2018-06-19 DIAGNOSIS — Z9049 Acquired absence of other specified parts of digestive tract: Secondary | ICD-10-CM | POA: Diagnosis not present

## 2018-06-19 DIAGNOSIS — Z8632 Personal history of gestational diabetes: Secondary | ICD-10-CM | POA: Insufficient documentation

## 2018-06-19 DIAGNOSIS — Z8249 Family history of ischemic heart disease and other diseases of the circulatory system: Secondary | ICD-10-CM | POA: Insufficient documentation

## 2018-06-19 DIAGNOSIS — R079 Chest pain, unspecified: Secondary | ICD-10-CM | POA: Diagnosis not present

## 2018-06-19 DIAGNOSIS — O99013 Anemia complicating pregnancy, third trimester: Secondary | ICD-10-CM | POA: Diagnosis not present

## 2018-06-19 DIAGNOSIS — Z833 Family history of diabetes mellitus: Secondary | ICD-10-CM | POA: Insufficient documentation

## 2018-06-19 DIAGNOSIS — Z3A31 31 weeks gestation of pregnancy: Secondary | ICD-10-CM | POA: Diagnosis not present

## 2018-06-19 DIAGNOSIS — O99613 Diseases of the digestive system complicating pregnancy, third trimester: Secondary | ICD-10-CM | POA: Diagnosis not present

## 2018-06-19 DIAGNOSIS — K224 Dyskinesia of esophagus: Secondary | ICD-10-CM | POA: Diagnosis not present

## 2018-06-19 HISTORY — DX: Gestational (pregnancy-induced) hypertension without significant proteinuria, unspecified trimester: O13.9

## 2018-06-19 HISTORY — DX: Gestational diabetes mellitus in pregnancy, unspecified control: O24.419

## 2018-06-19 LAB — TROPONIN I

## 2018-06-19 LAB — CBC
HEMATOCRIT: 31.4 % — AB (ref 36.0–46.0)
HEMOGLOBIN: 10.2 g/dL — AB (ref 12.0–15.0)
MCH: 25.6 pg — ABNORMAL LOW (ref 26.0–34.0)
MCHC: 32.5 g/dL (ref 30.0–36.0)
MCV: 78.7 fL (ref 78.0–100.0)
Platelets: 195 10*3/uL (ref 150–400)
RBC: 3.99 MIL/uL (ref 3.87–5.11)
RDW: 15.4 % (ref 11.5–15.5)
WBC: 12.4 10*3/uL — AB (ref 4.0–10.5)

## 2018-06-19 LAB — COMPREHENSIVE METABOLIC PANEL
ALBUMIN: 2.9 g/dL — AB (ref 3.5–5.0)
ALK PHOS: 76 U/L (ref 38–126)
ALT: 13 U/L (ref 0–44)
AST: 16 U/L (ref 15–41)
Anion gap: 11 (ref 5–15)
BILIRUBIN TOTAL: 0.2 mg/dL — AB (ref 0.3–1.2)
BUN: 8 mg/dL (ref 6–20)
CALCIUM: 9.4 mg/dL (ref 8.9–10.3)
CO2: 20 mmol/L — ABNORMAL LOW (ref 22–32)
CREATININE: 0.43 mg/dL — AB (ref 0.44–1.00)
Chloride: 103 mmol/L (ref 98–111)
GFR calc Af Amer: >60 mL/min (ref >60)
GLUCOSE: 97 mg/dL (ref 70–99)
Potassium: 4.2 mmol/L (ref 3.5–5.1)
Sodium: 134 mmol/L — ABNORMAL LOW (ref 135–145)
TOTAL PROTEIN: 6.5 g/dL (ref 6.5–8.1)

## 2018-06-19 LAB — URINALYSIS, ROUTINE W REFLEX MICROSCOPIC
Bilirubin Urine: NEGATIVE
Glucose, UA: NEGATIVE mg/dL
HGB URINE DIPSTICK: NEGATIVE
Ketones, ur: NEGATIVE mg/dL
Leukocytes, UA: NEGATIVE
Nitrite: NEGATIVE
PROTEIN: NEGATIVE mg/dL
SPECIFIC GRAVITY, URINE: 1.003 — AB (ref 1.005–1.030)
pH: 7 (ref 5.0–8.0)

## 2018-06-19 MED ORDER — GI COCKTAIL ~~LOC~~
30.0000 mL | Freq: Once | ORAL | Status: AC
  Administered 2018-06-19: 30 mL via ORAL
  Filled 2018-06-19: qty 30

## 2018-06-19 MED ORDER — PANTOPRAZOLE SODIUM 40 MG PO TBEC: 40.0000 mg | DELAYED_RELEASE_TABLET | Freq: Every day | ORAL | 2 refills | Status: DC

## 2018-06-19 NOTE — MAU Provider Note (Signed)
Chief Complaint:  Chest Pain   First Provider Initiated Contact with Patient 06/19/18 1329      HPI: Laura Sexton is a 32 y.o. G2P1001 at [redacted]w[redacted]d who presents to maternity admissions reporting onset of substernal chest pain last night, worsening today. The pain is constant, pressure pain in her mid chest radiating up into her upper chest.  It is associated with aching, tingling pain in both arms.  She reports frequent heartburn but this does not feel the same.  She denies shortness of breath.  There are no other associated symptoms. She feels normal fetal movement. She tried Zantac 150 mg PO at home today but it did not help.  She usually drinks mild for heartburn but this is not resolved by drinking milk.  HPI  Past Medical History: Past Medical History:  Diagnosis Date  . Ankle edema   . Cystic acne   . GERD (gastroesophageal reflux disease)   . Gestational diabetes   . Gestational diabetes mellitus, class A2 2016   on glyburide  . Headache   . Hyperlipidemia   . Irregular menstrual cycle   . Pregnancy induced hypertension    with first pregnancy    Past obstetric history: OB History  Gravida Para Term Preterm AB Living  2 1 1     1   SAB TAB Ectopic Multiple Live Births        0 1    # Outcome Date GA Lbr Len/2nd Weight Sex Delivery Anes PTL Lv  2 Current           1 Term 01/18/15 [redacted]w[redacted]d 14:20 / 01:30 2860 g M Vag-Spont EPI  LIV    Past Surgical History: Past Surgical History:  Procedure Laterality Date  . CHOLECYSTECTOMY N/A 06/18/2015   Procedure: LAPAROSCOPIC CHOLECYSTECTOMY WITH INTRAOPERATIVE CHOLANGIOGRAM;  Surgeon: Manus Rudd, MD;  Location: [facility] OR;  Service: General;  Laterality: N/A;  . HAND SURGERY     Cyst on Left Thumb-Age 25  . TOOTH EXTRACTION      Family History: Family History  Problem Relation Age of Onset  . Hypertension Mother        Living  . Hyperlipidemia Mother   . Diabetes Mother   . Hyperlipidemia Father        Living  . Hearing loss  Father   . Heart disease Father 26       CABG x 3  . Glaucoma Father   . Asthma Brother        Childhood, resolved  . Cancer Paternal Uncle   . Heart disease Maternal Grandmother   . Hyperlipidemia Maternal Grandmother   . Hepatitis Maternal Grandmother   . Heart disease Maternal Grandfather   . Hyperlipidemia Maternal Grandfather   . Heart disease Paternal Grandmother   . Hyperlipidemia Paternal Grandmother   . Heart disease Paternal Grandfather   . Hyperlipidemia Paternal Grandfather   . Lymphoma Paternal Aunt   . Healthy Sister        x2    Social History: Social History   Tobacco Use  . Smoking status: Never Smoker  . Smokeless tobacco: Never Used  Substance Use Topics  . Alcohol use: No  . Drug use: No    Allergies: No Known Allergies  Meds:  No medications prior to admission.    ROS:  Review of Systems  Constitutional: Negative for chills, fatigue and fever.  Eyes: Negative for visual disturbance.  Respiratory: Positive for chest tightness. Negative for shortness of breath.  Cardiovascular: Positive for chest pain.  Gastrointestinal: Negative for abdominal pain, nausea and vomiting.  Genitourinary: Negative for difficulty urinating, dysuria, flank pain, pelvic pain, vaginal bleeding, vaginal discharge and vaginal pain.  Musculoskeletal: Positive for myalgias.  Neurological: Negative for dizziness and headaches.  Psychiatric/Behavioral: Negative.      I have reviewed patient's Past Medical Hx, Surgical Hx, Family Hx, Social Hx, medications and allergies.   Physical Exam   Patient Vitals for the past 24 hrs:  BP Temp Temp src Pulse Resp SpO2 Height Weight  06/19/18 1515 106/68 - - 91 - - - -  06/19/18 1350 - - - - - 95 % - -  06/19/18 1345 - - - - - 95 % - -  06/19/18 1330 - - - - - 96 % - -  06/19/18 1320 - - - - - 95 % - -  06/19/18 1300 - - - - - 95 % - -  06/19/18 1245 - - - - - 97 % - -  06/19/18 1213 118/69 98.4 F (36.9 C) Oral 92 17 98 %  5\' 6"  (1.676 m) 98.4 kg   Constitutional: Well-developed, well-nourished female in no acute distress.  HEART: normal rate, heart sounds, regular rhythm RESP: normal effort, lung sounds clear and equal bilaterally GI: Abd soft, non-tender, gravid appropriate for gestational age.  MS: Extremities nontender, no edema, normal ROM Neurologic: Alert and oriented x 4.  GU: Neg CVAT.     FHT:  Baseline 135 , moderate variability, accelerations present, no decelerations Contractions: None on toco or to palpation   Labs: Results for orders placed or performed during the hospital encounter of 06/19/18 (from the past 24 hour(s))  Urinalysis, Routine w reflex microscopic     Status: Abnormal   Collection Time: 06/19/18 12:40 PM  Result Value Ref Range   Color, Urine STRAW (A) YELLOW   APPearance CLEAR CLEAR   Specific Gravity, Urine 1.003 (L) 1.005 - 1.030   pH 7.0 5.0 - 8.0   Glucose, UA NEGATIVE NEGATIVE mg/dL   Hgb urine dipstick NEGATIVE NEGATIVE   Bilirubin Urine NEGATIVE NEGATIVE   Ketones, ur NEGATIVE NEGATIVE mg/dL   Protein, ur NEGATIVE NEGATIVE mg/dL   Nitrite NEGATIVE NEGATIVE   Leukocytes, UA NEGATIVE NEGATIVE  CBC     Status: Abnormal   Collection Time: 06/19/18  1:14 PM  Result Value Ref Range   WBC 12.4 (H) 4.0 - 10.5 K/uL   RBC 3.99 3.87 - 5.11 MIL/uL   Hemoglobin 10.2 (L) 12.0 - 15.0 g/dL   HCT 16.1 (L) 09.6 - 04.5 %   MCV 78.7 78.0 - 100.0 fL   MCH 25.6 (L) 26.0 - 34.0 pg   MCHC 32.5 30.0 - 36.0 g/dL   RDW 40.9 81.1 - 91.4 %   Platelets 195 150 - 400 K/uL  Comprehensive metabolic panel     Status: Abnormal   Collection Time: 06/19/18  1:14 PM  Result Value Ref Range   Sodium 134 (L) 135 - 145 mmol/L   Potassium 4.2 3.5 - 5.1 mmol/L   Chloride 103 98 - 111 mmol/L   CO2 20 (L) 22 - 32 mmol/L   Glucose, Bld 97 70 - 99 mg/dL   BUN 8 6 - 20 mg/dL   Creatinine, Ser 7.82 (L) 0.44 - 1.00 mg/dL   Calcium 9.4 8.9 - 95.6 mg/dL   Total Protein 6.5 6.5 - 8.1 g/dL    Albumin 2.9 (L) 3.5 - 5.0 g/dL   AST 16  15 - 41 U/L   ALT 13 0 - 44 U/L   Alkaline Phosphatase 76 38 - 126 U/L   Total Bilirubin 0.2 (L) 0.3 - 1.2 mg/dL   GFR calc non Af Amer >60 >60 mL/min   GFR calc Af Amer >60 >60 mL/min   Anion gap 11 5 - 15  Troponin I     Status: None   Collection Time: 06/19/18  1:14 PM  Result Value Ref Range   Troponin I <0.03 <0.03 ng/mL     EKG: normal EKG, normal sinus rhythm.  Imaging:  No results found.  MAU Course/MDM: I have ordered labs and reviewed results.  NST reviewed and reactive With normal EKG and normal Troponin, no evidence of cardiac abnormality Heart and lung sounds normal, no tachycardia, and pt denies shortness of breath, even lying down in MAU, making PE unlikely Pt with frequent heartburn and is treating with home remedies like milk, not taking medications regularly, so most likely causes is acid reflux Consult Dr Chestine Spore with presentation, exam findings and test results.  Treatments in MAU included GI cocktail with some improvement but not resolution of symptoms. Rx for Protonix daily, avoid spicy or fried foods   Keep appt on Wednesday in the office Pt discharge with strict return precautions.   Assessment: 1. Chest pain at rest   2. Gastroesophageal reflux disease with esophagitis   3. Esophageal spasm   4. Pregnancy with 31 completed weeks gestation   5. Heartburn during pregnancy in third trimester     Plan: Discharge home Labor precautions and fetal kick counts Follow-up Information    Ob/Gyn, Nestor Ramp Follow up.   Why:  On 06/21/18 as scheduled.  Return to MAU as needed for worsening symptoms. Contact information: [address] [place] [address] [phone]          Allergies as of 06/19/2018   No Known Allergies     Medication List    STOP taking these medications   famotidine 20 MG tablet Commonly known as:  PEPCID   omeprazole 20 MG capsule Commonly known as:  PRILOSEC    oxyCODONE-acetaminophen 5-325 MG tablet Commonly known as:  PERCOCET/ROXICET   ranitidine 150 MG tablet Commonly known as:  ZANTAC     TAKE these medications   glyBURIDE 2.5 MG tablet Commonly known as:  DIABETA Take 5 mg by mouth at bedtime.   pantoprazole 40 MG tablet Commonly known as:  PROTONIX Take 1 tablet (40 mg total) by mouth daily.   prenatal multivitamin Tabs tablet Take 1 tablet by mouth daily.       Sharen Counter Certified Nurse-Midwife 06/19/2018 3:38 PM

## 2018-06-19 NOTE — MAU Note (Signed)
Pt reports mid sternal chest pain since last pm, pt thought the pain was related to gastric reflux. Tried Zantac this am without relief this am. States she is also having pain and heaviness in both upper arms

## 2018-07-21 LAB — OB RESULTS CONSOLE GBS: STREP GROUP B AG: NEGATIVE

## 2018-08-03 ENCOUNTER — Encounter (HOSPITAL_COMMUNITY): Payer: Self-pay | Admitting: *Deleted

## 2018-08-03 ENCOUNTER — Telehealth (HOSPITAL_COMMUNITY): Payer: Self-pay | Admitting: *Deleted

## 2018-08-03 NOTE — Telephone Encounter (Signed)
Preadmission screen  

## 2018-08-11 ENCOUNTER — Inpatient Hospital Stay (HOSPITAL_COMMUNITY): Payer: Medicaid Other | Admitting: Anesthesiology

## 2018-08-11 ENCOUNTER — Other Ambulatory Visit: Payer: Self-pay

## 2018-08-11 ENCOUNTER — Inpatient Hospital Stay (HOSPITAL_COMMUNITY)
Admission: RE | Admit: 2018-08-11 | Discharge: 2018-08-13 | DRG: 807 | Disposition: A | Discharge status: Home / Self Care | Payer: Medicaid Other | Attending: Obstetrics | Admitting: Obstetrics

## 2018-08-11 ENCOUNTER — Encounter (HOSPITAL_COMMUNITY): Payer: Self-pay

## 2018-08-11 DIAGNOSIS — O24415 Gestational diabetes mellitus in pregnancy, controlled by oral hypoglycemic drugs: Secondary | ICD-10-CM | POA: Diagnosis present

## 2018-08-11 DIAGNOSIS — Z3A39 39 weeks gestation of pregnancy: Secondary | ICD-10-CM | POA: Diagnosis not present

## 2018-08-11 DIAGNOSIS — O24425 Gestational diabetes mellitus in childbirth, controlled by oral hypoglycemic drugs: Secondary | ICD-10-CM | POA: Diagnosis present

## 2018-08-11 LAB — CBC
HCT: 32.2 % — ABNORMAL LOW (ref 36.0–46.0)
Hemoglobin: 10.5 g/dL — ABNORMAL LOW (ref 12.0–15.0)
MCH: 25.9 pg — AB (ref 26.0–34.0)
MCHC: 32.6 g/dL (ref 30.0–36.0)
MCV: 79.3 fL — AB (ref 80.0–100.0)
PLATELETS: 187 10*3/uL (ref 150–400)
RBC: 4.06 MIL/uL (ref 3.87–5.11)
RDW: 17.5 % — AB (ref 11.5–15.5)
WBC: 12.8 10*3/uL — AB (ref 4.0–10.5)

## 2018-08-11 LAB — GLUCOSE, CAPILLARY
GLUCOSE-CAPILLARY: 82 mg/dL (ref 70–99)
GLUCOSE-CAPILLARY: 75 mg/dL (ref 70–99)
GLUCOSE-CAPILLARY: 83 mg/dL (ref 70–99)
GLUCOSE-CAPILLARY: 76 mg/dL (ref 70–99)
Glucose-Capillary: 135 mg/dL — ABNORMAL HIGH (ref 70–99)
Glucose-Capillary: 85 mg/dL (ref 70–99)
Glucose-Capillary: 74 mg/dL (ref 70–99)

## 2018-08-11 LAB — TYPE AND SCREEN
ABO/RH(D): A POS
Antibody Screen: NEGATIVE

## 2018-08-11 LAB — RPR: RPR Ser Ql: NONREACTIVE

## 2018-08-11 MED ORDER — ACETAMINOPHEN 325 MG PO TABS
650.0000 mg | ORAL_TABLET | ORAL | Status: DC | PRN
  Administered 2018-08-11 (×2): 650 mg via ORAL
  Filled 2018-08-11 (×2): qty 2

## 2018-08-11 MED ORDER — FENTANYL 2.5 MCG/ML BUPIVACAINE 1/10 % EPIDURAL INFUSION (WH - ANES)
14.0000 mL/h | INTRAMUSCULAR | Status: DC | PRN
  Administered 2018-08-11 – 2018-08-12 (×2): 14 mL/h via EPIDURAL
  Filled 2018-08-11 (×2): qty 100

## 2018-08-11 MED ORDER — LIDOCAINE HCL (PF) 1 % IJ SOLN
INTRAMUSCULAR | Status: DC | PRN
  Administered 2018-08-11 (×2): 5 mL via EPIDURAL

## 2018-08-11 MED ORDER — OXYTOCIN BOLUS FROM INFUSION
500.0000 mL | Freq: Once | INTRAVENOUS | Status: AC
  Administered 2018-08-12: 500 mL via INTRAVENOUS

## 2018-08-11 MED ORDER — OXYCODONE-ACETAMINOPHEN 5-325 MG PO TABS: 2.0000 | ORAL_TABLET | ORAL | Status: DC | PRN

## 2018-08-11 MED ORDER — LACTATED RINGERS IV SOLN: 500.0000 mL | INTRAVENOUS | Status: DC | PRN

## 2018-08-11 MED ORDER — OXYCODONE-ACETAMINOPHEN 5-325 MG PO TABS: 1.0000 | ORAL_TABLET | ORAL | Status: DC | PRN

## 2018-08-11 MED ORDER — LIDOCAINE HCL (PF) 1 % IJ SOLN
30.0000 mL | INTRAMUSCULAR | Status: DC | PRN
  Filled 2018-08-11: qty 30

## 2018-08-11 MED ORDER — LACTATED RINGERS IV SOLN
500.0000 mL | Freq: Once | INTRAVENOUS | Status: AC
  Administered 2018-08-11: 500 mL via INTRAVENOUS

## 2018-08-11 MED ORDER — PHENYLEPHRINE 40 MCG/ML (10ML) SYRINGE FOR IV PUSH (FOR BLOOD PRESSURE SUPPORT)
80.0000 ug | PREFILLED_SYRINGE | INTRAVENOUS | Status: DC | PRN
  Filled 2018-08-11: qty 10
  Filled 2018-08-11: qty 5

## 2018-08-11 MED ORDER — OXYTOCIN 40 UNITS IN LACTATED RINGERS INFUSION - SIMPLE MED
2.5000 [IU]/h | INTRAVENOUS | Status: DC
  Administered 2018-08-12: 2.5 [IU]/h via INTRAVENOUS
  Filled 2018-08-11: qty 1000

## 2018-08-11 MED ORDER — EPHEDRINE 5 MG/ML INJ
10.0000 mg | INTRAVENOUS | Status: DC | PRN
  Filled 2018-08-11: qty 2

## 2018-08-11 MED ORDER — TERBUTALINE SULFATE 1 MG/ML IJ SOLN
0.2500 mg | Freq: Once | INTRAMUSCULAR | Status: DC | PRN
  Filled 2018-08-11: qty 1

## 2018-08-11 MED ORDER — SOD CITRATE-CITRIC ACID 500-334 MG/5ML PO SOLN: 30.0000 mL | ORAL | Status: DC | PRN

## 2018-08-11 MED ORDER — LACTATED RINGERS IV SOLN
INTRAVENOUS | Status: DC
  Administered 2018-08-11 (×2): via INTRAVENOUS

## 2018-08-11 MED ORDER — ONDANSETRON HCL 4 MG/2ML IJ SOLN
4.0000 mg | Freq: Four times a day (QID) | INTRAMUSCULAR | Status: DC | PRN
  Administered 2018-08-12: 4 mg via INTRAVENOUS
  Filled 2018-08-11: qty 2

## 2018-08-11 MED ORDER — DIPHENHYDRAMINE HCL 50 MG/ML IJ SOLN: 12.5000 mg | INTRAMUSCULAR | Status: DC | PRN

## 2018-08-11 MED ORDER — FENTANYL CITRATE (PF) 100 MCG/2ML IJ SOLN: 50.0000 ug | INTRAMUSCULAR | Status: DC | PRN

## 2018-08-11 MED ORDER — OXYTOCIN 40 UNITS IN LACTATED RINGERS INFUSION - SIMPLE MED
1.0000 m[IU]/min | INTRAVENOUS | Status: DC
  Administered 2018-08-11: 2 m[IU]/min via INTRAVENOUS
  Filled 2018-08-11: qty 1000

## 2018-08-11 NOTE — Anesthesia Preprocedure Evaluation (Signed)
Anesthesia Evaluation  Patient identified by MRN, date of birth, ID band Patient awake    Reviewed: Allergy & Precautions, H&P , NPO status , Patient's Chart, lab work & pertinent test results  History of Anesthesia Complications Negative for: history of anesthetic complications  Airway Mallampati: II  TM Distance: >3 FB Neck ROM: full    Dental no notable dental hx. (+) Teeth Intact   Pulmonary neg pulmonary ROS,    Pulmonary exam normal breath sounds clear to auscultation       Cardiovascular hypertension, Normal cardiovascular exam Rhythm:regular Rate:Normal     Neuro/Psych negative neurological ROS  negative psych ROS   GI/Hepatic negative GI ROS, Neg liver ROS,   Endo/Other  negative endocrine ROSdiabetes, Gestational  Renal/GU negative Renal ROS  negative genitourinary   Musculoskeletal   Abdominal   Peds  Hematology negative hematology ROS (+)   Anesthesia Other Findings H/o PIH  Reproductive/Obstetrics (+) Pregnancy                             Anesthesia Physical Anesthesia Plan  ASA: II  Anesthesia Plan: Epidural   Post-op Pain Management:    Induction:   PONV Risk Score and Plan:   Airway Management Planned:   Additional Equipment:   Intra-op Plan:   Post-operative Plan:   Informed Consent: I have reviewed the patients History and Physical, chart, labs and discussed the procedure including the risks, benefits and alternatives for the proposed anesthesia with the patient or authorized representative who has indicated his/her understanding and acceptance.     Plan Discussed with:   Anesthesia Plan Comments:         Anesthesia Quick Evaluation

## 2018-08-11 NOTE — Anesthesia Procedure Notes (Signed)
Epidural Patient location during procedure: OB  Staffing Anesthesiologist: Phillips Grout, MD Performed: anesthesiologist   Preanesthetic Checklist Completed: patient identified, site marked, surgical consent, pre-op evaluation, timeout performed, IV checked, risks and benefits discussed and monitors and equipment checked  Epidural Patient position: sitting Prep: DuraPrep Patient monitoring: heart rate, continuous pulse ox and blood pressure Location: L3-L4 Injection technique: LOR saline  Needle:  Needle type: Tuohy  Needle gauge: 17 G Needle length: 9 cm and 9 Needle insertion depth: 6 cm Catheter type: closed end flexible Catheter size: 20 Guage Catheter at skin depth: 10 cm Test dose: negative  Assessment Events: blood not aspirated, injection not painful, no injection resistance, negative IV test and no paresthesia  Additional Notes Patient identified. Risks/Benefits/Options discussed with patient including but not limited to bleeding, infection, nerve damage, paralysis, failed block, incomplete pain control, headache, blood pressure changes, nausea, vomiting, reactions to medication both or allergic, itching and postpartum back pain. Confirmed with bedside nurse the patient's most recent platelet count. Confirmed with patient that they are not currently taking any anticoagulation, have any bleeding history or any family history of bleeding disorders. Patient expressed understanding and wished to proceed. All questions were answered. Sterile technique was used throughout the entire procedure. Please see nursing notes for vital signs. Test dose was given through epidural needle and negative prior to continuing to dose epidural or start infusion. Warning signs of high block given to the patient including shortness of breath, tingling/numbness in hands, complete motor block, or any concerning symptoms with instructions to call for help. Patient was given instructions on fall risk  and not to get out of bed. All questions and concerns addressed with instructions to call with any issues.

## 2018-08-11 NOTE — H&P (Signed)
32 y.o. G2P1001 @ [redacted]w[redacted]d presents for induction of labor for GDMA2.  Otherwise has good fetal movement and no bleeding.  Pregnancy c/b:  1. GDMA2: on glyburide 10 mg qhs.  EFW on 10/15: 6lb 5oz (57%) Past Medical History:  Diagnosis Date  . Ankle edema   . Cystic acne   . GERD (gastroesophageal reflux disease)   . Gestational diabetes    glyburide  . Gestational diabetes mellitus, class A2 2016   on glyburide  . Headache   . Hyperlipidemia   . Irregular menstrual cycle   . Pregnancy induced hypertension    with first pregnancy    Past Surgical History:  Procedure Laterality Date  . CHOLECYSTECTOMY N/A 06/18/2015   Procedure: LAPAROSCOPIC CHOLECYSTECTOMY WITH INTRAOPERATIVE CHOLANGIOGRAM;  Surgeon: Manus Rudd, MD;  Location: [facility] OR;  Service: General;  Laterality: N/A;  . HAND SURGERY     Cyst on Left Thumb-Age 17  . TOOTH EXTRACTION      OB History  Gravida Para Term Preterm AB Living  2 1 1     1   SAB TAB Ectopic Multiple Live Births        0 1    # Outcome Date GA Lbr Len/2nd Weight Sex Delivery Anes PTL Lv  2 Current           1 Term 01/18/15 [redacted]w[redacted]d 14:20 / 01:30 2860 g M Vag-Spont EPI  LIV    Social History   Socioeconomic History  . Marital status: Married    Spouse name: Not on file  . Number of children: 0  . Years of education: 89  . Highest education level: Not on file  Occupational History  . Occupation: CMA    Employer: OTHER    Comment: [employer]   Social Needs  . Financial resource strain: Not hard at all  . Food insecurity:    Worry: Never true    Inability: Never true  . Transportation needs:    Medical: No    Non-medical: Not on file  Tobacco Use  . Smoking status: Never Smoker  . Smokeless tobacco: Never Used  Substance and Sexual Activity  . Alcohol use: No  . Drug use: No  . Sexual activity: Yes    Birth control/protection: None   Patient has no known allergies.    Prenatal Transfer Tool  Maternal Diabetes: Yes:  Diabetes  Type:  Insulin/Medication controlled glyburide Genetic Screening: Normal MaterniT21 Maternal Ultrasounds/Referrals: Normal Fetal Ultrasounds or other Referrals:  None Maternal Substance Abuse:  No Significant Maternal Medications:  Meds include: Other:  glyburide and pantoprazole Significant Maternal Lab Results: Lab values include: Group B Strep negative  ABO, Rh: --/--/A POS (11/01 1610) Antibody: PENDING (11/01 9604) Rubella: Immune (05/01 0000) RPR: Nonreactive (05/01 0000)  HBsAg: Negative (05/01 0000)  HIV: Non-reactive (05/01 0000)  GBS: Negative (10/11 0000)      Vitals:   08/11/18 0759 08/11/18 0828  BP: (!) 129/94 125/77  Pulse: 100 91  Resp: 18 18  Temp: 98.3 F (36.8 C)      General:  NAD Abdomen:  soft, gravid, EFW 7.5-8# Ex:  1+ edema SVE:  3/50/-3 per RN FHTs:  140s, mod var, Cat 1 Toco:  quiet   A/P   32 y.o. G2P1001 [redacted]w[redacted]d presents for IOL for GDMA2 IOL--cervix favorable, will start pitocin Epidural upon request GDMA2--FSBG q2h, insulin gtt prn  FSR/ vtx/ GBS neg  Laura Sexton Laura Sexton Laura Sexton

## 2018-08-12 ENCOUNTER — Encounter (HOSPITAL_COMMUNITY): Payer: Self-pay

## 2018-08-12 LAB — GLUCOSE, CAPILLARY
GLUCOSE-CAPILLARY: 118 mg/dL — AB (ref 70–99)
Glucose-Capillary: 102 mg/dL — ABNORMAL HIGH (ref 70–99)
Glucose-Capillary: 97 mg/dL (ref 70–99)

## 2018-08-12 MED ORDER — TETANUS-DIPHTH-ACELL PERTUSSIS 5-2.5-18.5 LF-MCG/0.5 IM SUSP: 0.5000 mL | Freq: Once | INTRAMUSCULAR | Status: DC

## 2018-08-12 MED ORDER — OXYCODONE HCL 5 MG PO TABS: 5.0000 mg | ORAL_TABLET | ORAL | Status: DC | PRN

## 2018-08-12 MED ORDER — IBUPROFEN 600 MG PO TABS
600.0000 mg | ORAL_TABLET | Freq: Four times a day (QID) | ORAL | Status: DC
  Administered 2018-08-12 – 2018-08-13 (×6): 600 mg via ORAL
  Filled 2018-08-12 (×6): qty 1

## 2018-08-12 MED ORDER — WITCH HAZEL-GLYCERIN EX PADS: 1.0000 "application " | MEDICATED_PAD | CUTANEOUS | Status: DC | PRN

## 2018-08-12 MED ORDER — OXYCODONE HCL 5 MG PO TABS: 10.0000 mg | ORAL_TABLET | ORAL | Status: DC | PRN

## 2018-08-12 MED ORDER — PRENATAL MULTIVITAMIN CH
1.0000 | ORAL_TABLET | Freq: Every day | ORAL | Status: DC
  Administered 2018-08-12 – 2018-08-13 (×2): 1 via ORAL
  Filled 2018-08-12 (×2): qty 1

## 2018-08-12 MED ORDER — DIPHENHYDRAMINE HCL 25 MG PO CAPS: 25.0000 mg | ORAL_CAPSULE | Freq: Four times a day (QID) | ORAL | Status: DC | PRN

## 2018-08-12 MED ORDER — METHYLERGONOVINE MALEATE 0.2 MG/ML IJ SOLN
0.2000 mg | Freq: Once | INTRAMUSCULAR | Status: AC
  Administered 2018-08-12: 0.2 mg via INTRAMUSCULAR

## 2018-08-12 MED ORDER — DIBUCAINE 1 % RE OINT: 1.0000 "application " | TOPICAL_OINTMENT | RECTAL | Status: DC | PRN

## 2018-08-12 MED ORDER — ONDANSETRON HCL 4 MG PO TABS: 4.0000 mg | ORAL_TABLET | ORAL | Status: DC | PRN

## 2018-08-12 MED ORDER — SIMETHICONE 80 MG PO CHEW: 80.0000 mg | CHEWABLE_TABLET | ORAL | Status: DC | PRN

## 2018-08-12 MED ORDER — ONDANSETRON HCL 4 MG/2ML IJ SOLN: 4.0000 mg | INTRAMUSCULAR | Status: DC | PRN

## 2018-08-12 MED ORDER — COCONUT OIL OIL: 1.0000 "application " | TOPICAL_OIL | Status: DC | PRN

## 2018-08-12 MED ORDER — PANTOPRAZOLE SODIUM 40 MG PO TBEC
40.0000 mg | DELAYED_RELEASE_TABLET | Freq: Every day | ORAL | Status: DC
  Administered 2018-08-12: 40 mg via ORAL
  Filled 2018-08-12: qty 1

## 2018-08-12 MED ORDER — ACETAMINOPHEN 325 MG PO TABS: 650.0000 mg | ORAL_TABLET | ORAL | Status: DC | PRN

## 2018-08-12 MED ORDER — SENNOSIDES-DOCUSATE SODIUM 8.6-50 MG PO TABS
2.0000 | ORAL_TABLET | ORAL | Status: DC
  Administered 2018-08-12: 2 via ORAL
  Filled 2018-08-12: qty 2

## 2018-08-12 MED ORDER — BENZOCAINE-MENTHOL 20-0.5 % EX AERO
1.0000 "application " | INHALATION_SPRAY | CUTANEOUS | Status: DC | PRN
  Administered 2018-08-12: 1 via TOPICAL
  Filled 2018-08-12: qty 56

## 2018-08-12 NOTE — Lactation Note (Signed)
This note was copied from a baby's chart. Lactation Consultation Note  Patient Name: Girl Mahagony Grieb ZOXWR'[id number] Date: 08/12/2018 Reason for consult: Initial assessment;Other (Comment);Term(per mom exp BF x 2 years- 1st baby was in NICU for  2 weeks / mom aware to page with feeding cues )   Maternal Data Does the patient have breastfeeding experience prior to this delivery?: Yes  Feeding Feeding Type: (last fed at 1330 )  LATCH Score ( Latch Score by [facility] )  Latch: Grasps breast easily, tongue down, lips flanged, rhythmical sucking.  Audible Swallowing: A few with stimulation  Type of Nipple: Everted at rest and after stimulation  Comfort (Breast/Nipple): Soft / non-tender  Hold (Positioning): No assistance needed to correctly position infant at breast.  LATCH Score: 9  Interventions Interventions: Breast feeding basics reviewed  Lactation Tools Discussed/Used WIC Program: No   Consult Status Consult Status: Follow-up Date: 08/12/18 Follow-up type: In-patient    Matilde Sprang [place] Forker 08/12/2018, 3:31 PM

## 2018-08-12 NOTE — Anesthesia Postprocedure Evaluation (Signed)
Anesthesia Post Note  Patient: Sports administrator  Procedure(s) Performed: AN AD HOC LABOR EPIDURAL     Patient location during evaluation: Mother Baby Anesthesia Type: Epidural Level of consciousness: awake and alert, oriented and patient cooperative Pain management: pain level controlled Vital Signs Assessment: post-procedure vital signs reviewed and stable Respiratory status: spontaneous breathing Cardiovascular status: stable Postop Assessment: no headache, epidural receding, patient able to bend at knees and no signs of nausea or vomiting Anesthetic complications: no Comments: Pain Score 4.    Last Vitals:  Vitals:   08/12/18 0915 08/12/18 1335  BP: 123/70 122/68  Pulse: 84 86  Resp: 18 18  Temp: 37.1 C 36.9 C  SpO2:      Last Pain:  Vitals:   08/12/18 1335  TempSrc: Oral  PainSc:    Pain Goal: Patients Stated Pain Goal: 3 (08/11/18 2130)               Merrilyn Puma

## 2018-08-12 NOTE — Progress Notes (Signed)
Patient seen and examined.  Long latent phase, but once she entered active labor has progressed along a normal labor curve.  Is now 9/100/-1.    BP 129/69   Pulse (!) 103   Temp 98.2 F (36.8 C) (Axillary)   Resp 18   Ht 5\' 6"  (1.676 m)   Wt 102.2 kg   SpO2 97%   BMI 36.38 kg/m   Toco: q2-3 min 130s, moderate variability, + accels, Cat 1  G2P1 @ [redacted]w[redacted]d w IOL for GDMA2 Continue pitocin Anticipate SVD Pelvis adequate GBS neg / vtx

## 2018-08-13 LAB — CBC
HEMATOCRIT: 27.5 % — AB (ref 36.0–46.0)
HEMOGLOBIN: 9 g/dL — AB (ref 12.0–15.0)
MCH: 26 pg (ref 26.0–34.0)
MCHC: 32.7 g/dL (ref 30.0–36.0)
MCV: 79.5 fL — AB (ref 80.0–100.0)
NRBC: 0 % (ref 0.0–0.2)
Platelets: 189 10*3/uL (ref 150–400)
RBC: 3.46 MIL/uL — AB (ref 3.87–5.11)
RDW: 17.1 % — AB (ref 11.5–15.5)
WBC: 14 10*3/uL — AB (ref 4.0–10.5)

## 2018-08-13 LAB — BIRTH TISSUE RECOVERY COLLECTION (PLACENTA DONATION)

## 2018-08-13 LAB — GLUCOSE, CAPILLARY: Glucose-Capillary: 107 mg/dL — ABNORMAL HIGH (ref 70–99)

## 2018-08-13 MED ORDER — IBUPROFEN 600 MG PO TABS: 600.0000 mg | ORAL_TABLET | Freq: Four times a day (QID) | ORAL | 0 refills | Status: DC

## 2018-08-13 NOTE — Lactation Note (Signed)
This note was copied from a baby's chart. Lactation Consultation Note  Patient Name: Laura Sexton [id number] Date: 08/13/2018 Reason for consult: Follow-up assessment;Term  P2 mother whose infant is now 70 hours old.  Mother would like to be discharged today.  Mother has breast feeding experience.  Baby was sleeping and not showing feeding cues when I arrived.  Since she had not fed since 0500 I offered to help awaken and assist with latching.  Mother accepted.  Mother removed baby's clothing and checked her diaper.  I assisted to latch in the football hold on the right breast without difficulty.  Mother denied pain with latching.  Demonstrated breast compressions with feeding and mother will perform this intermittently while baby feeds.  Mother stated that this was the best  latch since she started breast feeding.  She had been struggling a little bit with getting baby to open wide prior to latching.  I showed her a couple of ways to help with obtaining a wide open mouth prior to latching.  Baby was still feeding when I left the room.  Encouraged mother to continue feeding 8-12 times/24 hours or sooner if baby shows cues.  Awaken at the 3 hour interval to feed to help stimulate breasts to increase milk supply.    Engorgement prevention/treatment discussed.  Manual hand pump given with instructions for use.  #24 flange size is appropriate at this time.  Mother has our OP phone number to call as needed after discharge.  Family present and supportive.     Maternal Data Formula Feeding for Exclusion: No Has patient been taught Hand Expression?: Yes Does the patient have breastfeeding experience prior to this delivery?: Yes  Feeding Feeding Type: Breast Fed  LATCH Score Latch: Grasps breast easily, tongue down, lips flanged, rhythmical sucking.  Audible Swallowing: A few with stimulation  Type of Nipple: Everted at rest and after stimulation  Comfort (Breast/Nipple): Soft /  non-tender  Hold (Positioning): Assistance needed to correctly position infant at breast and maintain latch.  LATCH Score: 8  Interventions Interventions: Breast feeding basics reviewed;Assisted with latch;Skin to skin;Breast massage;Hand express;Position options;Support pillows;Adjust position;Breast compression;Hand pump  Lactation Tools Discussed/Used WIC Program: No Pump Review: Setup, frequency, and cleaning;Milk Storage Initiated by:: Zhaire Locker Date initiated:: 08/13/18   Consult Status Consult Status: Complete Date: 08/13/18 Follow-up type: Call as needed    Winnell Bento R Amarie Tarte 08/13/2018, 10:29 AM

## 2018-08-13 NOTE — Discharge Summary (Signed)
Obstetric Discharge Summary Reason for Admission: induction of labor Prenatal Procedures: none Intrapartum Procedures: spontaneous vaginal delivery Postpartum Procedures: none Complications-Operative and Postpartum: 2 degree perineal laceration and uterine atony resolved with methergine Hemoglobin  Date Value Ref Range Status  08/13/2018 9.0 (L) 12.0 - 15.0 g/dL Final   HCT  Date Value Ref Range Status  08/13/2018 27.5 (L) 36.0 - 46.0 % Final    Physical Exam:  General: alert, cooperative and appears stated age 32: appropriate Uterine Fundus: firm DVT Evaluation: No evidence of DVT seen on physical exam.  Discharge Diagnoses: Term Pregnancy-delivered  Discharge Information: Date: 08/13/2018 Activity: pelvic rest Diet: routine Medications: PNV and Ibuprofen Condition: stable Instructions: refer to practice specific booklet Discharge to: home Follow-up Information    Marlow Baars, MD Follow up in 4 week(s).   Specialty:  Obstetrics Contact information: [address] [place] [address] [phone]           Newborn Data: Live born female  Birth Weight: 8 lb 6.4 oz (3810 g) APGAR: 6, 9  Newborn Delivery   Birth date/time:  08/12/2018 05:01:00 Delivery type:  Vaginal, Spontaneous     Home with mother.  Mabry Tift GEFFEL Tata Timmins 08/13/2018, 9:29 AM

## 2018-08-13 NOTE — Progress Notes (Signed)
Patient is doing well.  She is ambulating, voiding, tolerating PO.  Pain control is good.  Lochia is appropriate.  Baby feeding well.  Desires discharge.   Vitals:   08/12/18 1335 08/12/18 2022 08/12/18 2231 08/13/18 0500  BP: 122/68 124/71 119/78 116/80  Pulse: 86 94 87 90  Resp: 18 16 18 18   Temp: 98.4 F (36.9 C) 98 F (36.7 C) 97.8 F (36.6 C) 97.8 F (36.6 C)  TempSrc: Oral Oral Oral Oral  SpO2:  99%  98%  Weight:      Height:        NAD Fundus firm Ext: 1+ edema b/l  Lab Results  Component Value Date   WBC 12.8 (H) 08/11/2018   HGB 10.5 (L) 08/11/2018   HCT 32.2 (L) 08/11/2018   MCV 79.3 (L) 08/11/2018   PLT 187 08/11/2018    --/--/A POS (11/01 0811)/RImmune  A/P 32 y.o. G2P2002 PPD#1. Routine care.   GDMA2--fastings wnl.  Plan 2hr gtt post partum Meeting all goals, d/c to home today  [facility] GEFFEL [place]

## 2018-10-11 DIAGNOSIS — R7303 Prediabetes: Secondary | ICD-10-CM

## 2018-10-11 HISTORY — DX: Prediabetes: R73.03

## 2018-11-08 ENCOUNTER — Ambulatory Visit (HOSPITAL_COMMUNITY)
Admission: EM | Admit: 2018-11-08 | Discharge: 2018-11-08 | Disposition: A | Discharge status: Home / Self Care | Payer: Medicaid Other | Attending: Family Medicine | Admitting: Family Medicine

## 2018-11-08 ENCOUNTER — Encounter (HOSPITAL_COMMUNITY): Payer: Self-pay

## 2018-11-08 DIAGNOSIS — M5432 Sciatica, left side: Secondary | ICD-10-CM

## 2018-11-08 MED ORDER — PREDNISONE 10 MG (21) PO TBPK: ORAL_TABLET | Freq: Every day | ORAL | 0 refills | Status: DC

## 2018-11-08 NOTE — ED Triage Notes (Signed)
Pt c/o lower back pain radiating down lt leg x2wks

## 2018-11-15 NOTE — ED Provider Notes (Signed)
[facility]   [id number] 11/08/18 Arrival Time: 1958  ASSESSMENT & PLAN:  1. Sciatica of left side    Able to ambulate here and hemodynamically stable. No indication for imaging of back at this time given no trauma and normal neurological exam. Discussed.  Meds ordered this encounter  Medications  . predniSONE (STERAPRED UNI-PAK 21 TAB) 10 MG (21) TBPK tablet    Sig: Take by mouth daily. Take as directed.    Dispense:  21 tablet    Refill:  0   Medication sedation precautions given. Encourage ROM/movement as tolerated.  Follow-up Information    Zanard, Hinton Dyer, MD.   Specialty:  Family Medicine Why:  As needed. Contact information: [address] [place] [address] [phone]          Reviewed expectations re: course of current medical issues. Questions answered. Outlined signs and symptoms indicating need for more acute intervention. Patient verbalized understanding. After Visit Summary given.   SUBJECTIVE: History from: patient.  Laura Sexton is a 33 y.o. female who presents with complaint of intermittent left sided lower back discomfort. Onset gradual, over the past 2 weeks. Injury/trama: no. History of back problems: occasional with similar symptoms. Discomfort described as stabbing with radiation down the back of her L leg. Pain worsened with certain movements and improved with rest. Progressive LE weakness or saddle anesthesia: none. Extremity sensation changes or weakness: none. Ambulatory without difficulty. Normal bowel/bladder habits: yes without urinary retention. No associated abdominal pain/n/v. Self treatment: has tried OTC analgesics without help.  Reports no chronic steroid use, fevers, IV drug use, or recent back surgeries or procedures.  ROS: As per HPI. All other systems negative.   OBJECTIVE:  Vitals:   11/08/18 2026  BP: 121/68  Pulse: 89  Resp: 18  Temp: 98.2 F (36.8 C)  TempSrc: Oral  SpO2: 100%      General appearance: alert; no distress Neck: supple with FROM; without midline tenderness CV: RRR without murmer Lungs: unlabored respirations; symmetrical air entry Abdomen: soft, non-tender; non-distended Back: mild left sided tenderness of her lower paraspinal musculature and left buttock; FROM at waist; bruising: none; without midline tenderness; does reports pain with left SLR Extremities: no edema; symmetrical with no gross deformities; normal ROM of bilateral lower extremities Skin: warm and dry Neurologic: normal gait; normal reflexes of RLE and LLE; normal sensation of RLE and LLE; normal strength of RLE and LLE Psychological: alert and cooperative; normal mood and affect  No Known Allergies  Past Medical History:  Diagnosis Date  . Ankle edema   . Cystic acne   . GERD (gastroesophageal reflux disease)   . Gestational diabetes    glyburide  . Gestational diabetes mellitus, class A2 2016   on glyburide  . Headache   . Hyperlipidemia   . Irregular menstrual cycle   . Pregnancy induced hypertension    with first pregnancy   Social History   Socioeconomic History  . Marital status: Married    Spouse name: Not on file  . Number of children: 0  . Years of education: 30  . Highest education level: Not on file  Occupational History  . Occupation: CMA    Employer: OTHER    Comment: [employer]   Social Needs  . Financial resource strain: Not hard at all  . Food insecurity:    Worry: Never true    Inability: Never true  . Transportation needs:    Medical: No  Non-medical: Not on file  Tobacco Use  . Smoking status: Never Smoker  . Smokeless tobacco: Never Used  Substance and Sexual Activity  . Alcohol use: No  . Drug use: No  . Sexual activity: Yes    Birth control/protection: None  Lifestyle  . Physical activity:    Days per week: Not on file    Minutes per session: Not on file  . Stress: Only a little  Relationships  . Social connections:     Talks on phone: Not on file    Gets together: Not on file    Attends religious service: Not on file    Active member of club or organization: Not on file    Attends meetings of clubs or organizations: Not on file    Relationship status: Not on file  . Intimate partner violence:    Fear of current or ex partner: No    Emotionally abused: No    Physically abused: No    Forced sexual activity: No  Other Topics Concern  . 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 Hypertension Mother        Living  . Hyperlipidemia Mother   . Diabetes Mother   . Hyperlipidemia Father        Living  . Hearing loss Father   . Heart disease Father 53       CABG x 3  . Glaucoma Father   . Asthma Brother        Childhood, resolved  . Cancer Paternal Uncle   . Heart disease Maternal Grandmother   . Hyperlipidemia Maternal Grandmother   . Hepatitis Maternal Grandmother   . Heart disease Maternal Grandfather   . Hyperlipidemia Maternal Grandfather   . Heart disease Paternal Grandmother   . Hyperlipidemia Paternal Grandmother   . Heart disease Paternal Grandfather   . Hyperlipidemia Paternal Grandfather   . Lymphoma Paternal Aunt   . Healthy Sister        x2   Past Surgical History:  Procedure Laterality Date  . CHOLECYSTECTOMY N/A 06/18/2015   Procedure: LAPAROSCOPIC CHOLECYSTECTOMY WITH INTRAOPERATIVE CHOLANGIOGRAM;  Surgeon: Manus Rudd, MD;  Location: [facility] OR;  Service: General;  Laterality: N/A;  . HAND SURGERY      Cyst on Left Thumb-Age 55  . TOOTH EXTRACTION       Mardella Layman, MD 11/15/18 1506

## 2019-06-12 ENCOUNTER — Other Ambulatory Visit (HOSPITAL_COMMUNITY)
Admission: RE | Admit: 2019-06-12 | Discharge: 2019-06-12 | Disposition: A | Discharge status: Home / Self Care | Payer: Medicaid Other | Source: Ambulatory Visit | Attending: Obstetrics and Gynecology | Admitting: Obstetrics and Gynecology

## 2019-06-12 DIAGNOSIS — Z20828 Contact with and (suspected) exposure to other viral communicable diseases: Secondary | ICD-10-CM | POA: Diagnosis not present

## 2019-06-12 DIAGNOSIS — Z01812 Encounter for preprocedural laboratory examination: Secondary | ICD-10-CM | POA: Diagnosis not present

## 2019-06-12 LAB — SARS CORONAVIRUS 2 (TAT 6-24 HRS): SARS Coronavirus 2: NEGATIVE

## 2019-06-13 ENCOUNTER — Other Ambulatory Visit: Payer: Self-pay | Admitting: Obstetrics and Gynecology

## 2019-06-13 NOTE — H&P (Signed)
33 y.o. [id number] with retained IUD not in EM.  At recent gyn exam, strings of Mirena IUD were not seen on exam. US showed no IUD in EM. Pt sent for Xray and IUD did show up on flat plate. Since then pt started having RLQ cramping at night severe enough to wake her up. She has not taken any meds for this. She is worried the pain could be from IUD but I d/w her it may not be.   IUD not documented in uterus but flat plate xray unsure if next to or in myometrium of uterus. Likely it is in abdomen but not certain. Will do hysteroscopy first- if IUD in myometrium, will remove via hysteroscope. If not, will proceed with laparoscopic removal of IUD. Will also look carefully in RLQ for anyother possible cause of RLQ pain.    All R/B/Alt d/w pt and she agrees to proceed. SCDs during op.   Past Medical History:  Diagnosis Date  . Ankle edema   . Cystic acne   . GERD (gastroesophageal reflux disease)   . Gestational diabetes    glyburide  . Gestational diabetes mellitus, class A2 2016   on glyburide  . Headache   . Hyperlipidemia   . Irregular menstrual cycle   . Pregnancy induced hypertension    with first pregnancy   Past Surgical History:  Procedure Laterality Date  . CHOLECYSTECTOMY N/A 06/18/2015   Procedure: LAPAROSCOPIC CHOLECYSTECTOMY WITH INTRAOPERATIVE CHOLANGIOGRAM;  Surgeon: Manus RuddMatthew Tsuei, MD;  Location: [facility] OR;  Service: General;  Laterality: N/A;  . HAND SURGERY     Cyst on Left Thumb-Age 14  . TOOTH EXTRACTION      Social History   Socioeconomic History  . Marital status: Married    Spouse name: Not on file  . Number of children: 0  . Years of education: 7914  . Highest education level: Not on file  Occupational History  . Occupation: CMA    Employer: OTHER    Comment: [employer]   Social Needs  . Financial resource strain: Not hard at all  . Food insecurity    Worry: Never true    Inability: Never true  . Transportation needs    Medical: No    Non-medical: Not on  file  Tobacco Use  . Smoking status: Never Smoker  . Smokeless tobacco: Never Used  Substance and Sexual Activity  . Alcohol use: No  . Drug use: No  . Sexual activity: Yes    Birth control/protection: None  Lifestyle  . Physical activity    Days per week: Not on file    Minutes per session: Not on file  . Stress: Only a little  Relationships  . Social Musicianconnections    Talks on phone: Not on file    Gets together: Not on file    Attends religious service: Not on file    Active member of club or organization: Not on file    Attends meetings of clubs or organizations: Not on file    Relationship status: Not on file  . Intimate partner violence    Fear of current or ex partner: No    Emotionally abused: No    Physically abused: No    Forced sexual activity: No  Other Topics Concern  . 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 Medical illustrator)    Tobacco Use/Exposure:  None    Alcohol Use:  None   Drug Use:  None   Diet:  Regular   Exercise:  [employer] (5 x week)    Hobbies: Cake Baking              No current facility-administered medications on file prior to encounter.    Current Outpatient Medications on File Prior to Encounter  Medication Sig Dispense Refill  . ibuprofen (ADVIL,MOTRIN) 600 MG tablet Take 1 tablet (600 mg total) by mouth every 6 (six) hours. 30 tablet 0  . predniSONE (STERAPRED UNI-PAK 21 TAB) 10 MG (21) TBPK tablet Take by mouth daily. Take as directed. 21 tablet 0  . Prenatal Vit-Fe Fumarate-FA (PRENATAL MULTIVITAMIN) TABS tablet Take 1 tablet by mouth daily.       No Known Allergies  There were no vitals filed for this visit.  Lungs: clear to ascultation Cor:  RRR Abdomen:  soft,  nontender, nondistended. Ex:  no cords, erythema Pelvic:  Normal EFG, normal uterus and adnexa, no pain on exam at [facility]  A/p:  IUD not documented in uterus but flat plate xray unsure if next to or in myometrium of uterus. Likely it is in abdomen but not certain. Will do hysteroscopy first- if IUD in myometrium, will remove via hysteroscope. If not, will proceed with laparoscopic removal of IUD. Will also look carefully in RLQ for anyother possible cause of RLQ pain.    All R/B/Alt d/w pt and she agrees to proceed. SCDs during op.   Daria Pastures

## 2019-06-14 ENCOUNTER — Other Ambulatory Visit: Payer: Self-pay

## 2019-06-14 ENCOUNTER — Encounter (HOSPITAL_BASED_OUTPATIENT_CLINIC_OR_DEPARTMENT_OTHER): Payer: Self-pay | Admitting: *Deleted

## 2019-06-14 NOTE — Progress Notes (Signed)
Spoke w/ pt via phone for pre-op interview.  Npo after mn.  Arrive at 0530.  Needs cbc and urine preg.  Pt had covid test done 06-12-2019.

## 2019-06-15 ENCOUNTER — Other Ambulatory Visit: Payer: Self-pay

## 2019-06-15 ENCOUNTER — Encounter (HOSPITAL_BASED_OUTPATIENT_CLINIC_OR_DEPARTMENT_OTHER): Payer: Self-pay | Admitting: *Deleted

## 2019-06-15 ENCOUNTER — Ambulatory Visit (HOSPITAL_BASED_OUTPATIENT_CLINIC_OR_DEPARTMENT_OTHER)
Admission: RE | Admit: 2019-06-15 | Discharge: 2019-06-15 | Disposition: A | Discharge status: Home / Self Care | Payer: Medicaid Other | Attending: Obstetrics and Gynecology | Admitting: Obstetrics and Gynecology

## 2019-06-15 ENCOUNTER — Ambulatory Visit (HOSPITAL_BASED_OUTPATIENT_CLINIC_OR_DEPARTMENT_OTHER): Payer: Medicaid Other | Admitting: Anesthesiology

## 2019-06-15 ENCOUNTER — Encounter (HOSPITAL_BASED_OUTPATIENT_CLINIC_OR_DEPARTMENT_OTHER)
Admission: RE | Disposition: A | Discharge status: Home / Self Care | Payer: Self-pay | Source: Home / Self Care | Attending: Obstetrics and Gynecology

## 2019-06-15 DIAGNOSIS — Z8632 Personal history of gestational diabetes: Secondary | ICD-10-CM | POA: Insufficient documentation

## 2019-06-15 DIAGNOSIS — Z9049 Acquired absence of other specified parts of digestive tract: Secondary | ICD-10-CM | POA: Diagnosis not present

## 2019-06-15 DIAGNOSIS — Z791 Long term (current) use of non-steroidal anti-inflammatories (NSAID): Secondary | ICD-10-CM | POA: Insufficient documentation

## 2019-06-15 DIAGNOSIS — K219 Gastro-esophageal reflux disease without esophagitis: Secondary | ICD-10-CM | POA: Diagnosis not present

## 2019-06-15 DIAGNOSIS — Y762 Prosthetic and other implants, materials and accessory obstetric and gynecological devices associated with adverse incidents: Secondary | ICD-10-CM | POA: Insufficient documentation

## 2019-06-15 DIAGNOSIS — T8332XA Displacement of intrauterine contraceptive device, initial encounter: Secondary | ICD-10-CM | POA: Insufficient documentation

## 2019-06-15 DIAGNOSIS — E785 Hyperlipidemia, unspecified: Secondary | ICD-10-CM | POA: Diagnosis not present

## 2019-06-15 HISTORY — PX: LAPAROSCOPY: SHX197

## 2019-06-15 HISTORY — DX: Stress incontinence (female) (male): N39.3

## 2019-06-15 HISTORY — DX: Personal history of gestational diabetes: Z86.32

## 2019-06-15 HISTORY — PX: HYSTEROSCOPY: SHX211

## 2019-06-15 HISTORY — DX: Displacement of intrauterine contraceptive device, initial encounter: T83.32XA

## 2019-06-15 HISTORY — DX: Personal history of other complications of pregnancy, childbirth and the puerperium: Z87.59

## 2019-06-15 HISTORY — DX: Presence of spectacles and contact lenses: Z97.3

## 2019-06-15 LAB — CBC
HCT: 37 % (ref 36.0–46.0)
Hemoglobin: 11.6 g/dL — ABNORMAL LOW (ref 12.0–15.0)
MCH: 24.6 pg — ABNORMAL LOW (ref 26.0–34.0)
MCHC: 31.4 g/dL (ref 30.0–36.0)
MCV: 78.4 fL — ABNORMAL LOW (ref 80.0–100.0)
Platelets: 252 10*3/uL (ref 150–400)
RBC: 4.72 MIL/uL (ref 3.87–5.11)
RDW: 14.3 % (ref 11.5–15.5)
WBC: 11.9 10*3/uL — ABNORMAL HIGH (ref 4.0–10.5)
nRBC: 0 % (ref 0.0–0.2)

## 2019-06-15 LAB — POCT PREGNANCY, URINE: Preg Test, Ur: NEGATIVE

## 2019-06-15 LAB — GLUCOSE, CAPILLARY: Glucose-Capillary: 97 mg/dL (ref 70–99)

## 2019-06-15 SURGERY — LAPAROSCOPY OPERATIVE
Anesthesia: General | Site: Uterus

## 2019-06-15 MED ORDER — LIDOCAINE HCL (CARDIAC) PF 100 MG/5ML IV SOSY
PREFILLED_SYRINGE | INTRAVENOUS | Status: DC | PRN
  Administered 2019-06-15: 100 mg via INTRAVENOUS

## 2019-06-15 MED ORDER — SOD CITRATE-CITRIC ACID 500-334 MG/5ML PO SOLN
ORAL | Status: AC
  Filled 2019-06-15: qty 30

## 2019-06-15 MED ORDER — FENTANYL CITRATE (PF) 100 MCG/2ML IJ SOLN
INTRAMUSCULAR | Status: DC | PRN
  Administered 2019-06-15 (×2): 50 ug via INTRAVENOUS
  Administered 2019-06-15: 100 ug via INTRAVENOUS

## 2019-06-15 MED ORDER — SODIUM CHLORIDE 0.9 % IR SOLN
Status: DC | PRN
  Administered 2019-06-15: 3000 mL

## 2019-06-15 MED ORDER — SODIUM CHLORIDE (PF) 0.9 % IJ SOLN
INTRAMUSCULAR | Status: DC | PRN
  Administered 2019-06-15: 30 mL

## 2019-06-15 MED ORDER — 0.9 % SODIUM CHLORIDE (POUR BTL) OPTIME
TOPICAL | Status: DC | PRN
  Administered 2019-06-15: 500 mL

## 2019-06-15 MED ORDER — DEXAMETHASONE SODIUM PHOSPHATE 10 MG/ML IJ SOLN
INTRAMUSCULAR | Status: AC
  Filled 2019-06-15: qty 1

## 2019-06-15 MED ORDER — ROCURONIUM BROMIDE 10 MG/ML (PF) SYRINGE
PREFILLED_SYRINGE | INTRAVENOUS | Status: AC
  Filled 2019-06-15: qty 10

## 2019-06-15 MED ORDER — MIDAZOLAM HCL 2 MG/2ML IJ SOLN
INTRAMUSCULAR | Status: AC
  Filled 2019-06-15: qty 2

## 2019-06-15 MED ORDER — LACTATED RINGERS IV SOLN
INTRAVENOUS | Status: DC
  Administered 2019-06-15: 06:00:00 via INTRAVENOUS
  Filled 2019-06-15: qty 1000

## 2019-06-15 MED ORDER — SUGAMMADEX SODIUM 200 MG/2ML IV SOLN
INTRAVENOUS | Status: DC | PRN
  Administered 2019-06-15: 200 mg via INTRAVENOUS

## 2019-06-15 MED ORDER — MIDAZOLAM HCL 5 MG/5ML IJ SOLN
INTRAMUSCULAR | Status: DC | PRN
  Administered 2019-06-15: 2 mg via INTRAVENOUS

## 2019-06-15 MED ORDER — ROCURONIUM BROMIDE 100 MG/10ML IV SOLN
INTRAVENOUS | Status: DC | PRN
  Administered 2019-06-15: 60 mg via INTRAVENOUS

## 2019-06-15 MED ORDER — LIDOCAINE 2% (20 MG/ML) 5 ML SYRINGE
INTRAMUSCULAR | Status: AC
  Filled 2019-06-15: qty 5

## 2019-06-15 MED ORDER — LACTATED RINGERS IV SOLN
INTRAVENOUS | Status: DC
  Administered 2019-06-15: 07:00:00 via INTRAVENOUS
  Filled 2019-06-15: qty 1000

## 2019-06-15 MED ORDER — ROPIVACAINE HCL 5 MG/ML IJ SOLN
INTRAMUSCULAR | Status: DC | PRN
  Administered 2019-06-15: 30 mL

## 2019-06-15 MED ORDER — EPHEDRINE 5 MG/ML INJ
INTRAVENOUS | Status: AC
  Filled 2019-06-15: qty 10

## 2019-06-15 MED ORDER — PROPOFOL 10 MG/ML IV BOLUS
INTRAVENOUS | Status: AC
  Filled 2019-06-15: qty 40

## 2019-06-15 MED ORDER — KETOROLAC TROMETHAMINE 30 MG/ML IJ SOLN
INTRAMUSCULAR | Status: DC | PRN
  Administered 2019-06-15: 30 mg via INTRAVENOUS

## 2019-06-15 MED ORDER — EPHEDRINE SULFATE 50 MG/ML IJ SOLN
INTRAMUSCULAR | Status: DC | PRN
  Administered 2019-06-15 (×3): 10 mg via INTRAVENOUS

## 2019-06-15 MED ORDER — ONDANSETRON HCL 4 MG/2ML IJ SOLN
INTRAMUSCULAR | Status: DC | PRN
  Administered 2019-06-15: 4 mg via INTRAVENOUS

## 2019-06-15 MED ORDER — DEXAMETHASONE SODIUM PHOSPHATE 4 MG/ML IJ SOLN
INTRAMUSCULAR | Status: DC | PRN
  Administered 2019-06-15: 10 mg via INTRAVENOUS

## 2019-06-15 MED ORDER — FENTANYL CITRATE (PF) 100 MCG/2ML IJ SOLN
INTRAMUSCULAR | Status: AC
  Filled 2019-06-15: qty 2

## 2019-06-15 MED ORDER — SOD CITRATE-CITRIC ACID 500-334 MG/5ML PO SOLN
30.0000 mL | ORAL | Status: AC
  Administered 2019-06-15: 07:00:00 30 mL via ORAL
  Filled 2019-06-15: qty 30

## 2019-06-15 MED ORDER — MEPERIDINE HCL 25 MG/ML IJ SOLN
6.2500 mg | INTRAMUSCULAR | Status: DC | PRN
  Filled 2019-06-15: qty 1

## 2019-06-15 MED ORDER — ONDANSETRON HCL 4 MG/2ML IJ SOLN
4.0000 mg | Freq: Once | INTRAMUSCULAR | Status: DC | PRN
  Filled 2019-06-15: qty 2

## 2019-06-15 MED ORDER — FENTANYL CITRATE (PF) 100 MCG/2ML IJ SOLN
INTRAMUSCULAR | Status: AC
  Filled 2019-06-15: qty 4

## 2019-06-15 MED ORDER — OXYCODONE-ACETAMINOPHEN 5-325 MG PO TABS: 1.0000 | ORAL_TABLET | ORAL | 0 refills | Status: DC | PRN

## 2019-06-15 MED ORDER — PROPOFOL 10 MG/ML IV BOLUS
INTRAVENOUS | Status: DC | PRN
  Administered 2019-06-15: 30 mg via INTRAVENOUS
  Administered 2019-06-15: 170 mg via INTRAVENOUS

## 2019-06-15 MED ORDER — HYDROMORPHONE HCL 1 MG/ML IJ SOLN
0.2500 mg | INTRAMUSCULAR | Status: DC | PRN
  Filled 2019-06-15: qty 0.5

## 2019-06-15 SURGICAL SUPPLY — 51 items
ABLATOR SURESOUND NOVASURE (ABLATOR) IMPLANT
BIPOLAR CUTTING LOOP 21FR (ELECTRODE)
CABLE HIGH FREQUENCY MONO STRZ (ELECTRODE) IMPLANT
CANISTER SUCT 3000ML PPV (MISCELLANEOUS) ×4 IMPLANT
CATH ROBINSON RED A/P 16FR (CATHETERS) ×4 IMPLANT
COVER MAYO STAND STRL (DRAPES) ×4 IMPLANT
COVER WAND RF STERILE (DRAPES) ×4 IMPLANT
DERMABOND ADVANCED (GAUZE/BANDAGES/DRESSINGS) ×2
DERMABOND ADVANCED .7 DNX12 (GAUZE/BANDAGES/DRESSINGS) ×2 IMPLANT
DILATOR CANAL MILEX (MISCELLANEOUS) IMPLANT
DRSG COVADERM PLUS 2X2 (GAUZE/BANDAGES/DRESSINGS) ×4 IMPLANT
DURAPREP 26ML APPLICATOR (WOUND CARE) ×4 IMPLANT
ELECT REM PT RETURN 9FT ADLT (ELECTROSURGICAL)
ELECTRODE REM PT RTRN 9FT ADLT (ELECTROSURGICAL) IMPLANT
GAUZE 4X4 16PLY RFD (DISPOSABLE) ×4 IMPLANT
GLOVE BIO SURGEON STRL SZ7 (GLOVE) ×4 IMPLANT
GOWN STRL REUS W/TWL LRG LVL3 (GOWN DISPOSABLE) ×4 IMPLANT
GOWN STRL REUS W/TWL XL LVL3 (GOWN DISPOSABLE) ×4 IMPLANT
KIT PROCEDURE FLUENT (KITS) ×4 IMPLANT
KIT TURNOVER CYSTO (KITS) ×4 IMPLANT
LIGASURE VESSEL 5MM BLUNT TIP (ELECTROSURGICAL) IMPLANT
LOOP CUTTING BIPOLAR 21FR (ELECTRODE) IMPLANT
NDL INSUFFLATION 14GA 150MM (NEEDLE) IMPLANT
NEEDLE HYPO 22GX1.5 SAFETY (NEEDLE) IMPLANT
NEEDLE INSUFFLATION 120MM (ENDOMECHANICALS) ×4 IMPLANT
NEEDLE INSUFFLATION 14GA 150MM (NEEDLE) IMPLANT
NS IRRIG 500ML POUR BTL (IV SOLUTION) ×4 IMPLANT
PACK LAPAROSCOPY BASIN (CUSTOM PROCEDURE TRAY) ×4 IMPLANT
PACK TRENDGUARD 450 HYBRID PRO (MISCELLANEOUS) ×2 IMPLANT
PACK VAGINAL MINOR WOMEN LF (CUSTOM PROCEDURE TRAY) ×4 IMPLANT
PAD OB MATERNITY 4.3X12.25 (PERSONAL CARE ITEMS) ×4 IMPLANT
POUCH SPECIMEN RETRIEVAL 10MM (ENDOMECHANICALS) IMPLANT
SCISSORS LAP 5X35 DISP (ENDOMECHANICALS) IMPLANT
SEAL ROD LENS SCOPE MYOSURE (ABLATOR) ×4 IMPLANT
SEALER TISSUE G2 CVD JAW 35 (ENDOMECHANICALS) IMPLANT
SEALER TISSUE G2 CVD JAW 45CM (ENDOMECHANICALS)
SET IRRIG TUBING LAPAROSCOPIC (IRRIGATION / IRRIGATOR) IMPLANT
SOLUTION ELECTROLUBE (MISCELLANEOUS) IMPLANT
SUT VICRYL 0 UR6 27IN ABS (SUTURE) ×4 IMPLANT
SUT VICRYL RAPIDE 3 0 (SUTURE) IMPLANT
SUT VICRYL RAPIDE 4/0 PS 2 (SUTURE) ×6 IMPLANT
SYR 50ML LL SCALE MARK (SYRINGE) ×4 IMPLANT
TOWEL OR 17X26 10 PK STRL BLUE (TOWEL DISPOSABLE) ×4 IMPLANT
TRAY FOLEY W/BAG SLVR 14FR (SET/KITS/TRAYS/PACK) ×4 IMPLANT
TRENDGUARD 450 HYBRID PRO PACK (MISCELLANEOUS) ×4
TROCAR DILATING TIP 12MM 150MM (ENDOMECHANICALS) IMPLANT
TROCAR OPTI TIP 5M 100M (ENDOMECHANICALS) ×8 IMPLANT
TROCAR XCEL DIL TIP R 11M (ENDOMECHANICALS) ×4 IMPLANT
TUBING EVAC SMOKE HEATED PNEUM (TUBING) ×4 IMPLANT
WARMER LAPAROSCOPE (MISCELLANEOUS) ×4 IMPLANT
WATER STERILE IRR 500ML POUR (IV SOLUTION) ×4 IMPLANT

## 2019-06-15 NOTE — Anesthesia Postprocedure Evaluation (Signed)
Anesthesia Post Note  Patient: Psychologist, forensic  Procedure(s) Performed: DIAGNOSTIC LAPAROSCOPY, LAPAROSCOPIC REMOVAL OF INTRAUTERINE DEVICE (N/A Abdomen) HYSTEROSCOPY (N/A Uterus)     Patient location during evaluation: PACU Anesthesia Type: General Level of consciousness: awake and alert Pain management: pain level controlled Vital Signs Assessment: post-procedure vital signs reviewed and stable Respiratory status: spontaneous breathing, nonlabored ventilation, respiratory function stable and patient connected to nasal cannula oxygen Cardiovascular status: blood pressure returned to baseline and stable Postop Assessment: no apparent nausea or vomiting Anesthetic complications: no    Last Vitals:  Vitals:   06/15/19 0900 06/15/19 1000  BP: 104/72 110/76  Pulse: 78 70  Resp: 13 14  Temp:  36.4 C  SpO2: 96% 100%    Last Pain:  Vitals:   06/15/19 0945  TempSrc:   PainSc: 3                  Tangy Drozdowski DAVID

## 2019-06-15 NOTE — Discharge Instructions (Signed)
Do NOT take Motrin/Advil/Ibuprofen until after 2:30 pm today.   General Anesthesia, Adult, Care After This sheet gives you information about how to care for yourself after your procedure. Your health care provider may also give you more specific instructions. If you have problems or questions, contact your health care provider. What can I expect after the procedure? After the procedure, the following side effects are common:  Pain or discomfort at the IV site.  Nausea.  Vomiting.  Sore throat.  Trouble concentrating.  Feeling cold or chills.  Weak or tired.  Sleepiness and fatigue.  Soreness and body aches. These side effects can affect parts of the body that were not involved in surgery. Follow these instructions at home:  For at least 24 hours after the procedure:  Have a responsible adult stay with you. It is important to have someone help care for you until you are awake and alert.  Rest as needed.  Do not: ? Participate in activities in which you could fall or become injured. ? Drive. ? Use heavy machinery. ? Drink alcohol. ? Take sleeping pills or medicines that cause drowsiness. ? Make important decisions or sign legal documents. ? Take care of children on your own. Eating and drinking  Follow any instructions from your health care provider about eating or drinking restrictions.  When you feel hungry, start by eating small amounts of foods that are soft and easy to digest (bland), such as toast. Gradually return to your regular diet.  Drink enough fluid to keep your urine pale yellow.  If you vomit, rehydrate by drinking water, juice, or clear broth. General instructions  If you have sleep apnea, surgery and certain medicines can increase your risk for breathing problems. Follow instructions from your health care provider about wearing your sleep device: ? Anytime you are sleeping, including during daytime naps. ? While taking prescription pain medicines,  sleeping medicines, or medicines that make you drowsy.  Return to your normal activities as told by your health care provider. Ask your health care provider what activities are safe for you.  Take over-the-counter and prescription medicines only as told by your health care provider.  If you smoke, do not smoke without supervision.  Keep all follow-up visits as told by your health care provider. This is important. Contact a health care provider if:  You have nausea or vomiting that does not get better with medicine.  You cannot eat or drink without vomiting.  You have pain that does not get better with medicine.  You are unable to pass urine.  You develop a skin rash.  You have a fever.  You have redness around your IV site that gets worse. Get help right away if:  You have difficulty breathing.  You have chest pain.  You have blood in your urine or stool, or you vomit blood. Summary  After the procedure, it is common to have a sore throat or nausea. It is also common to feel tired.  Have a responsible adult stay with you for the first 24 hours after general anesthesia. It is important to have someone help care for you until you are awake and alert.  When you feel hungry, start by eating small amounts of foods that are soft and easy to digest (bland), such as toast. Gradually return to your regular diet.  Drink enough fluid to keep your urine pale yellow.  Return to your normal activities as told by your health care provider. Ask your health care  provider what activities are safe for you. This information is not intended to replace advice given to you by your health care provider. Make sure you discuss any questions you have with your health care provider. Document Released: 01/03/2001 Document Revised: 09/30/2017 Document Reviewed: 05/13/2017 Elsevier Patient Education  2020 Elsevier Inc. Diagnostic Laparoscopy, Care After This sheet gives you information about how to  care for yourself after your procedure. Your health care provider may also give you more specific instructions. If you have problems or questions, contact your health care provider. What can I expect after the procedure? After the procedure, it is common to have:  Mild discomfort in the abdomen.  Sore throat. Women who have laparoscopy with pelvic examination may have mild cramping and fluid coming from the vagina for a few days after the procedure. Follow these instructions at home: Medicines  Take over-the-counter and prescription medicines only as told by your health care provider.  If you were prescribed an antibiotic medicine, take it as told by your health care provider. Do not stop taking the antibiotic even if you start to feel better. Driving  Do not drive for 24 hours if you were given a medicine to help you relax (sedative) during your procedure.  Do not drive or use heavy machinery while taking prescription pain medicine. Bathing  Do not take baths, swim, or use a hot tub until your health care provider approves. You may take showers. Incision care   Follow instructions from your health care provider about how to take care of your incisions. Make sure you: ? Wash your hands with soap and water before you change your bandage (dressing). If soap and water are not available, use hand sanitizer. ? Change your dressing as told by your health care provider. ? Leave stitches (sutures), skin glue, or adhesive strips in place. These skin closures may need to stay in place for 2 weeks or longer. If adhesive strip edges start to loosen and curl up, you may trim the loose edges. Do not remove adhesive strips completely unless your health care provider tells you to do that.  Check your incision areas every day for signs of infection. Check for: ? Redness, swelling, or pain. ? Fluid or blood. ? Warmth. ? Pus or a bad smell. Activity  Return to your normal activities as told by your  health care provider. Ask your health care provider what activities are safe for you.  Do not lift anything that is heavier than 10 lb (4.5 kg), or the limit that you are told, until your health care provider says that it is safe. General instructions  To prevent or treat constipation while you are taking prescription pain medicine, your health care provider may recommend that you: ? Drink enough fluid to keep your urine pale yellow. ? Take over-the-counter or prescription medicines. ? Eat foods that are high in fiber, such as fresh fruits and vegetables, whole grains, and beans. ? Limit foods that are high in fat and processed sugars, such as fried and sweet foods.  Do not use any products that contain nicotine or tobacco, such as cigarettes and e-cigarettes. If you need help quitting, ask your health care provider.  Keep all follow-up visits as told by your health care provider. This is important. Contact a health care provider if:  You develop shoulder pain.  You feel lightheaded or faint.  You are unable to pass gas or have a bowel movement.  You feel nauseous or you vomit.  You develop a rash.  You have redness, swelling, or pain around any incision.  You have fluid or blood coming from any incision.  Any incision feels warm to the touch.  You have pus or a bad smell coming from any incision.  You have a fever or chills. Get help right away if:  You have severe pain.  You have vomiting that does not go away.  You have heavy bleeding from the vagina.  Any incision opens.  You have trouble breathing.  You have chest pain. Summary  After the procedure, it is common to have mild discomfort in the abdomen and a sore throat.  Check your incision areas every day for signs of infection.  Return to your normal activities as told by your health care provider. Ask your health care provider what activities are safe for you. This information is not intended to replace  advice given to you by your health care provider. Make sure you discuss any questions you have with your health care provider. Document Released: 09/08/2015 Document Revised: 09/09/2017 Document Reviewed: 03/23/2017 Elsevier Patient Education  2020 [employer].

## 2019-06-15 NOTE — Progress Notes (Signed)
There has been no change in the patients history, status or exam since the history and physical.  Vitals:   06/14/19 1629 06/15/19 0553 06/15/19 0624  BP:  107/70   Pulse:  79   Resp:  16   Temp:  98 F (36.7 C)   TempSrc:  Oral   SpO2:  100%   Weight: 88.5 kg 87.8 kg   Height: 5\' 6"  (1.676 m)  5\' 6"  (1.676 m)    Results for orders placed or performed during the hospital encounter of 06/15/19 (from the past 72 hour(s))  Pregnancy, urine POC     Status: None   Collection Time: 06/15/19  6:11 AM  Result Value Ref Range   Preg Test, Ur NEGATIVE NEGATIVE    Comment:        THE SENSITIVITY OF THIS METHODOLOGY IS >24 mIU/mL   CBC     Status: Abnormal   Collection Time: 06/15/19  6:30 AM  Result Value Ref Range   WBC 11.9 (H) 4.0 - 10.5 K/uL   RBC 4.72 3.87 - 5.11 MIL/uL   Hemoglobin 11.6 (L) 12.0 - 15.0 g/dL   HCT 37.0 36.0 - 46.0 %   MCV 78.4 (L) 80.0 - 100.0 fL   MCH 24.6 (L) 26.0 - 34.0 pg   MCHC 31.4 30.0 - 36.0 g/dL   RDW 14.3 11.5 - 15.5 %   Platelets 252 150 - 400 K/uL   nRBC 0.0 0.0 - 0.2 %    Comment: Performed at [facility], [place] [address] [place], [place] [address]  Glucose, capillary     Status: None   Collection Time: 06/15/19  6:38 AM  Result Value Ref Range   Glucose-Capillary 97 70 - 99 mg/dL    Daria Pastures

## 2019-06-15 NOTE — Anesthesia Preprocedure Evaluation (Signed)
Anesthesia Evaluation  Patient identified by MRN, date of birth, ID band Patient awake    Reviewed: Allergy & Precautions, NPO status , Patient's Chart, lab work & pertinent test results  Airway Mallampati: II  TM Distance: >3 FB Neck ROM: Full    Dental   Pulmonary    Pulmonary exam normal        Cardiovascular Normal cardiovascular exam     Neuro/Psych    GI/Hepatic   Endo/Other    Renal/GU      Musculoskeletal   Abdominal   Peds  Hematology   Anesthesia Other Findings   Reproductive/Obstetrics                             Anesthesia Physical Anesthesia Plan  ASA: II  Anesthesia Plan: General   Post-op Pain Management:    Induction:   PONV Risk Score and Plan: 3 and Ondansetron, Midazolam and Treatment may vary due to age or medical condition  Airway Management Planned: Oral ETT  Additional Equipment:   Intra-op Plan:   Post-operative Plan: Extubation in OR  Informed Consent: I have reviewed the patients History and Physical, chart, labs and discussed the procedure including the risks, benefits and alternatives for the proposed anesthesia with the patient or authorized representative who has indicated his/her understanding and acceptance.       Plan Discussed with: CRNA and Surgeon  Anesthesia Plan Comments:         Anesthesia Quick Evaluation

## 2019-06-15 NOTE — Anesthesia Procedure Notes (Signed)
Procedure Name: Intubation Date/Time: 06/15/2019 7:38 AM Performed by: Justice Rocher, CRNA Pre-anesthesia Checklist: Patient identified, Emergency Drugs available, Suction available and Patient being monitored Patient Re-evaluated:Patient Re-evaluated prior to induction Oxygen Delivery Method: Circle system utilized Preoxygenation: Pre-oxygenation with 100% oxygen Induction Type: IV induction Ventilation: Mask ventilation without difficulty Laryngoscope Size: Mac and 3 Grade View: Grade II Tube type: Oral Tube size: 7.5 mm Number of attempts: 1 Airway Equipment and Method: Stylet and Oral airway Placement Confirmation: ETT inserted through vocal cords under direct vision,  positive ETCO2 and breath sounds checked- equal and bilateral Secured at: 22 (teeth ) cm Tube secured with: Tape Dental Injury: Teeth and Oropharynx as per pre-operative assessment

## 2019-06-15 NOTE — Brief Op Note (Signed)
06/15/2019  8:23 AM  PATIENT:  Laura Sexton  33 y.o. female  PRE-OPERATIVE DIAGNOSIS:  MALPOSITION OF IUD  POST-OPERATIVE DIAGNOSIS:  MALPOSITION OF IUD  PROCEDURE:  Procedure(s): DIAGNOSTIC LAPAROSCOPY, LAPAROSCOPIC REMOVAL OF INTRAUTERINE DEVICE (N/A) HYSTEROSCOPY (N/A)  SURGEON:  Surgeon(s) and Role:    * Bobbye Charleston, MD - Primary   ASSISTANTS: Dr. Jerelyn Charles   ANESTHESIA:   general  EBL:  5 mL   LOCAL MEDICATIONS USED:  OTHER Ropivicaine  SPECIMEN:  No Specimen  DISPOSITION OF SPECIMEN:  N/A  COUNTS:  YES  TOURNIQUET:  * No tourniquets in log *  DICTATION: .Note written in EPIC  PLAN OF CARE: Discharge to home after PACU  PATIENT DISPOSITION:  PACU - hemodynamically stable.   Delay start of Pharmacological VTE agent (>24hrs) due to surgical blood loss or risk of bleeding: not applicable

## 2019-06-15 NOTE — Transfer of Care (Signed)
Immediate Anesthesia Transfer of Care Note  Patient: Laura Sexton  Procedure(s) Performed: Procedure(s) (LRB): DIAGNOSTIC LAPAROSCOPY, LAPAROSCOPIC REMOVAL OF INTRAUTERINE DEVICE (N/A) HYSTEROSCOPY (N/A)  Patient Location: PACU  Anesthesia Type: General  Level of Consciousness: awake, sedated, patient cooperative and responds to stimulation  Airway & Oxygen Therapy: Patient Spontanous Breathing and Patient connected to [place] O2 and soft FM   Post-op Assessment: Report given to PACU RN, Post -op Vital signs reviewed and stable and Patient moving all extremities  Post vital signs: Reviewed and stable  Complications: No apparent anesthesia complications

## 2019-06-15 NOTE — Op Note (Signed)
06/15/2019  8:23 AM  PATIENT:  Laura Sexton  33 y.o. female  PRE-OPERATIVE DIAGNOSIS:  MALPOSITION OF IUD  POST-OPERATIVE DIAGNOSIS:  MALPOSITION OF IUD  PROCEDURE:  Procedure(s): DIAGNOSTIC LAPAROSCOPY, LAPAROSCOPIC REMOVAL OF INTRAUTERINE DEVICE (N/A) HYSTEROSCOPY (N/A)  SURGEON:  Surgeon(s) and Role:    * Bobbye Charleston, MD - Primary   ASSISTANTS: Dr. Jerelyn Charles   ANESTHESIA:   general  EBL:  5 mL   LOCAL MEDICATIONS USED:  OTHER Ropivicaine  SPECIMEN:  No Specimen  DISPOSITION OF SPECIMEN:  N/A  COUNTS:  YES  TOURNIQUET:  * No tourniquets in log *  DICTATION: .Note written in EPIC  PLAN OF CARE: Discharge to home after PACU  PATIENT DISPOSITION:  PACU - hemodynamically stable.   Delay start of Pharmacological VTE agent (>24hrs) due to surgical blood loss or risk of bleeding: not applicable  Findings: Normal uterus, tubes and ovaries.  The retained IUD was lying on the posterior leaf of the R broad ligament.  There were no adhesions or signs of endometriosis.  The uterine cavity was normal.   Complications:   After adequate anesthesia was achieved, the patient was prepped and draped in the usual sterile fashion.  The speculum was placed in the vagina and the cervix stabilized with a single-tooth tenaculum.  The cervix was dilated with pratt dilators and the hysteroscope passed inside the endometrial cavity.  The IUD was not located in the uterine cavity and we proceeded to laparascopy after placing the uterine manipulator in the uterus.  A 1 cm incision was made just below the umbilicus and the abdominal wall tented up.  The Veress needle was inserted at a 45 degree angle to the pelvis and no bowel contents or blood were aspirated.  The abdomen was insufflated and the 10 mm trocar placed without complication.  The operative scope was introduced and the above findings noted. Two 5 mm trocars were then placed under direct visualization either side of the 10 mm  port and slightly below.   Graspers were introduced and as the R ovary and tube were everted laterally, the IUD was located lying on the posterior broad ligament on the R.  The IUD was able to be grasped and removed with some blunt dissection from the paratubal fat.  The IUD was removed from the abdomen through the 10 mm trocar intact.    After a quick inspection of the pelvis and liver edge, all instruments were removed and the abdomen was desufflated.  The 10 mm faschial incision was closed with a figure of eight stitch of 2-vicryl and all the skin incisions were closed with 4-0 vicryl R and dermabond.  All instruments were removed from the vagina and the patient returned to the PACU in stable condition.  Khylie Larmore A

## 2019-06-19 ENCOUNTER — Encounter (HOSPITAL_BASED_OUTPATIENT_CLINIC_OR_DEPARTMENT_OTHER): Payer: Self-pay | Admitting: Obstetrics and Gynecology

## 2020-08-21 ENCOUNTER — Encounter (HOSPITAL_BASED_OUTPATIENT_CLINIC_OR_DEPARTMENT_OTHER): Payer: Self-pay | Admitting: Emergency Medicine

## 2020-08-21 ENCOUNTER — Other Ambulatory Visit: Payer: Self-pay

## 2020-08-21 ENCOUNTER — Emergency Department (HOSPITAL_BASED_OUTPATIENT_CLINIC_OR_DEPARTMENT_OTHER)
Admission: EM | Admit: 2020-08-21 | Discharge: 2020-08-21 | Disposition: A | Discharge status: Home / Self Care | Payer: Medicaid Other | Attending: Emergency Medicine | Admitting: Emergency Medicine

## 2020-08-21 DIAGNOSIS — M26609 Unspecified temporomandibular joint disorder, unspecified side: Secondary | ICD-10-CM | POA: Diagnosis not present

## 2020-08-21 DIAGNOSIS — R519 Headache, unspecified: Secondary | ICD-10-CM | POA: Insufficient documentation

## 2020-08-21 LAB — PREGNANCY, URINE: Preg Test, Ur: NEGATIVE

## 2020-08-21 MED ORDER — KETOROLAC TROMETHAMINE 60 MG/2ML IM SOLN
60.0000 mg | Freq: Once | INTRAMUSCULAR | Status: AC
  Administered 2020-08-21: 60 mg via INTRAMUSCULAR
  Filled 2020-08-21: qty 2

## 2020-08-21 NOTE — ED Notes (Signed)
Pt states pain is improving. Denies vision changes/ dizziness/weakness. Stroke screen negative. Denies sinus problems/congestion. Pt has hx of headaches, has not taken anything for the pain today.

## 2020-08-21 NOTE — ED Provider Notes (Signed)
[facility] EMERGENCY DEPARTMENT Provider Note   CSN: [id number] Arrival date & time: 08/21/20  1025     History Chief Complaint  Patient presents with   Headache    Laura Sexton is a 34 y.o. female with no significant PMH who presents the ED with complaints of headache.  On my examination, patient informed that she had a little bit of a left-sided headache yesterday, but did not think much of it because it was consistent with her history of headaches.  However, this morning she noticed that it was focally located over her temporal artery.  She states that this was new and unusual and wanted to come in to the ED for evaluation.  She denies any associated symptoms.  She states that it was 9 out of 10 earlier, but is now currently a 5 out of 10 on my examination.  She denies any thunderclap presentation and states that it was gradual in onset.  She denies any associated fevers or chills, recent illness or infection, neck stiffness or pain, chest discomfort, nausea or vomiting, palpitations, lightheadedness or dizziness, visual or auditory impairment, precipitating injury or event, ataxia, numbness or weakness, or other symptoms.  She does not take any medications regularly and denies any significant past medical history.  She simply wanted to come in for evaluation.  She is uncertain as to her pregnancy status.  She thought about taking Tylenol for her pain, but wanted to be evaluated first.  HPI     Past Medical History:  Diagnosis Date   Headache    History of gestational diabetes    History of pregnancy induced hypertension    Hyperlipidemia    IUD migration    SUI (stress urinary incontinence, female)    Wears contact lenses     Patient Active Problem List   Diagnosis Date Noted   Gestational diabetes mellitus (GDM) controlled on oral hypoglycemic drug 08/11/2018   Abnormal glucose tolerance test (GTT) during pregnancy, antepartum 03/15/2018   Postpartum  state 01/18/2015   Indication for care in labor or delivery 01/17/2015   GDM (gestational diabetes mellitus)    [redacted] weeks gestation of pregnancy    Positive pregnancy test 06/03/2014   Abnormal finding on urinalysis 06/03/2014   Overweight 06/30/2013   Obesity, unspecified 05/14/2013   Leg swelling 01/21/2013   Screening for malignant neoplasm of the cervix 01/21/2013   Routine general medical examination at a health care facility 01/21/2013    Past Surgical History:  Procedure Laterality Date   CHOLECYSTECTOMY N/A 06/18/2015   Procedure: LAPAROSCOPIC CHOLECYSTECTOMY WITH INTRAOPERATIVE CHOLANGIOGRAM;  Surgeon: Manus Rudd, MD;  Location: [facility] OR;  Service: General;  Laterality: N/A;   HAND SURGERY  age 8   Cyst on Left Thumb-   HYSTEROSCOPY N/A 06/15/2019   Procedure: HYSTEROSCOPY;  Surgeon: Carrington Clamp, MD;  Location: [facility];  Service: Gynecology;  Laterality: N/A;   LAPAROSCOPY N/A 06/15/2019   Procedure: DIAGNOSTIC LAPAROSCOPY, LAPAROSCOPIC REMOVAL OF INTRAUTERINE DEVICE;  Surgeon: Carrington Clamp, MD;  Location: [facility] [place];  Service: Gynecology;  Laterality: N/A;     OB History    Gravida  2   Para  2   Term  2   Preterm      AB      Living  2     SAB      TAB      Ectopic      Multiple  0   Live Births  2  Family History  Problem Relation Age of Onset   Hypertension Mother        Living   Hyperlipidemia Mother    Diabetes Mother    Hyperlipidemia Father        Living   Hearing loss Father    Heart disease Father 63       CABG x 3   Glaucoma Father    Asthma Brother        Childhood, resolved   Cancer Paternal Uncle    Heart disease Maternal Grandmother    Hyperlipidemia Maternal Grandmother    Hepatitis Maternal Grandmother    Heart disease Maternal Grandfather    Hyperlipidemia Maternal Grandfather    Heart disease Paternal Grandmother    Hyperlipidemia  Paternal Grandmother    Heart disease Paternal Grandfather    Hyperlipidemia Paternal Grandfather    Lymphoma Paternal Aunt    Healthy Sister        x2    Social History   Tobacco Use   Smoking status: Never Smoker   Smokeless tobacco: Never Used  Building services engineer Use: Never used  Substance Use Topics   Alcohol use: No   Drug use: Never    Home Medications Prior to Admission medications   Medication Sig Start Date End Date Taking? Authorizing Provider  norethindrone (MICRONOR) 0.35 MG tablet Take 1 tablet by mouth at bedtime.    [provider]  oxyCODONE-acetaminophen (PERCOCET/ROXICET) 5-325 MG tablet Take 1 tablet by mouth every 4 (four) hours as needed for severe pain. 06/15/19   Carrington Clamp, MD  Prenatal Vit-Fe Fumarate-FA (PRENATAL MULTIVITAMIN) TABS tablet Take 1 tablet by mouth daily.     [provider]    Allergies    Patient has no known allergies.  Review of Systems   Review of Systems  All other systems reviewed and are negative.   Physical Exam Updated Vital Signs BP 103/68    Pulse 74    Temp (!) 97.5 F (36.4 C) (Oral)    Resp 16    Ht 5\' 6"  (1.676 m)    Wt 93.9 kg    LMP 07/15/2020    SpO2 98%    BMI 33.41 kg/m   Physical Exam Vitals and nursing note reviewed. Exam conducted with a chaperone present.  Constitutional:      General: She is not in acute distress.    Appearance: Normal appearance. She is not ill-appearing.  HENT:     Head: Normocephalic and atraumatic.     Right Ear: Tympanic membrane, ear canal and external ear normal. There is no impacted cerumen.     Left Ear: Tympanic membrane, ear canal and external ear normal. There is no impacted cerumen.     Ears:     Comments: Normal ear exams.  Hearing grossly intact and symmetric bilaterally.  No external ear or temporal area TTP.    Nose: Nose normal.  Eyes:     General: No scleral icterus.    Extraocular Movements: Extraocular movements intact.      Conjunctiva/sclera: Conjunctivae normal.     Pupils: Pupils are equal, round, and reactive to light.     Comments: No nystagmus.  Cardiovascular:     Rate and Rhythm: Normal rate.     Pulses: Normal pulses.  Pulmonary:     Effort: Pulmonary effort is normal. No respiratory distress.  Skin:    General: Skin is dry.     Capillary Refill: Capillary refill takes less  than 2 seconds.  Neurological:     General: No focal deficit present.     Mental Status: She is alert and oriented to person, place, and time. Mental status is at baseline.     GCS: GCS eye subscore is 4. GCS verbal subscore is 5. GCS motor subscore is 6.     Cranial Nerves: No cranial nerve deficit.     Sensory: No sensory deficit.     Motor: No weakness.     Coordination: Coordination normal.     Gait: Gait normal.     Comments: Ambulates without ataxia or difficulty.  CN II through XII grossly intact.  Negative Brudzinski test.  Negative Romberg and cerebellar exams.  Psychiatric:        Mood and Affect: Mood normal.        Behavior: Behavior normal.        Thought Content: Thought content normal.     ED Results / Procedures / Treatments   Labs (all labs ordered are listed, but only abnormal results are displayed) Labs Reviewed  PREGNANCY, URINE    EKG None  Radiology No results found.  Procedures Procedures (including critical care time)  Medications Ordered in ED Medications  ketorolac (TORADOL) injection 60 mg (60 mg Intramuscular Given 08/21/20 1237)    ED Course  I have reviewed the triage vital signs and the nursing notes.  Pertinent labs & imaging results that were available during my care of the patient were reviewed by me and considered in my medical decision making (see chart for details).    MDM Rules/Calculators/A&P                          Patient's neurologic examination is entirely benign.  Given her age, low suspicion for a temporal arteritis versus GCA.  History and physical exam  as low suspicion for space-occupying lesion or acute bleed.  She denies any precipitating injury.  She denies any visual changes or hearing deficits.  Ear exam was without any significant findings.  She denies any fevers, chills, abdominal pain, nausea or vomiting, numbness or weakness, room spinning dizziness or lightheadedness, palpitations, or any other associated symptoms.  She denies any recent heavy menses that would otherwise be concerning for anemia.  She is hemodynamically stable here in the ED.  Do not feel as the laboratory work-up would yield any significant findings.  Instead, will obtain urine pregnancy given that she is uncertain as to her pregnancy status.  If negative, will treat with Toradol and discharged home with outpatient follow-up.  Discussed why do not feel as though CT head is warranted at this time, patient agrees with assessment and plan.  On subsequent evaluation, patient states that she has a habit of grinding her teeth.  Perhaps this is related to TMJ dysfunction.  Encouraging her to use mouthguard at night and follow-up with her dentist.  She also follow-up with her primary care provider.  She feels improved after Toradol administration is reasonable for discharge.  ED return precautions discussed.  Patient voices understanding and is agreeable to the plan.  Final Clinical Impression(s) / ED Diagnoses Final diagnoses:  Acute nonintractable headache, unspecified headache type  TMJ dysfunction    Rx / DC Orders ED Discharge Orders    None       Lorelee New, PA-C 08/21/20 1341    Rolan Bucco, MD 08/21/20 1514

## 2020-08-21 NOTE — Discharge Instructions (Signed)
Your history and physical exam was reassuring.  You are not pregnant and I suggest taking continued NSAIDs as needed such as naproxen or ibuprofen.  I do agree with you that this is likely partially related to your TMJ dysfunction, particularly given the focal area for your headache symptoms.  I encourage you to follow-up again with your dentist.  I have provided an attachment for you to read.  Please return to the ED or seek immediate medical attention should you experience any new or worsening symptoms.

## 2020-08-21 NOTE — ED Notes (Signed)
Pt ambulatory to restroom without difficulty.

## 2020-08-21 NOTE — ED Triage Notes (Signed)
Pain to L temporal area radiating into the top of her head that started this morning.

## 2020-10-11 NOTE — L&D Delivery Note (Signed)
Patient was C/C/+1 and pushed for about 10 minutes with epidural.    NSVD  female infant, Apgars 8,9, weight P.   The patient had second degree midline perineal laceration repaired with 2-0 R. Fundus was firm. EBL was above expected amount- about 500 cc; methergine given Placenta was delivered intact. Vagina was clear.  Delayed cord clamping done for 30-60 seconds while warming baby. Baby was vigorous and doing skin to skin with mother.  Laura Sexton

## 2020-11-07 LAB — OB RESULTS CONSOLE RPR: RPR: NONREACTIVE

## 2020-11-07 LAB — OB RESULTS CONSOLE HEPATITIS B SURFACE ANTIGEN: Hepatitis B Surface Ag: NEGATIVE

## 2020-11-07 LAB — OB RESULTS CONSOLE ABO/RH: RH Type: POSITIVE

## 2020-11-07 LAB — OB RESULTS CONSOLE RUBELLA ANTIBODY, IGM: Rubella: IMMUNE

## 2020-11-07 LAB — OB RESULTS CONSOLE GC/CHLAMYDIA
Chlamydia: NEGATIVE
Gonorrhea: NEGATIVE

## 2020-11-07 LAB — OB RESULTS CONSOLE HIV ANTIBODY (ROUTINE TESTING): HIV: NONREACTIVE

## 2020-11-07 LAB — OB RESULTS CONSOLE ANTIBODY SCREEN: Antibody Screen: NEGATIVE

## 2020-11-24 ENCOUNTER — Encounter: Payer: Self-pay | Admitting: Dietician

## 2020-11-24 ENCOUNTER — Other Ambulatory Visit: Payer: Self-pay

## 2020-11-24 ENCOUNTER — Encounter: Payer: 59 | Attending: Obstetrics and Gynecology | Admitting: Dietician

## 2020-11-24 DIAGNOSIS — O24415 Gestational diabetes mellitus in pregnancy, controlled by oral hypoglycemic drugs: Secondary | ICD-10-CM | POA: Diagnosis not present

## 2020-11-24 NOTE — Progress Notes (Signed)
Patient was seen on 11/24/2020 for Gestational Diabetes self-management 1:1 visit at the [facility].   Patient is here today alone.  She is [redacted] weeks gestation. She lives with her husband and 2 children. She has had GDM with her previous 2 pregnancies. She notes HgbA1C of 6.0%.  Discussed that this meets criteria for prediabetes. 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 She is not exercising currently.  She has had increased stress recently and lack of time (35 yo hospitalized recently with ruptured appendix). Weight 212 lbs which patient thinks is close to her prepregnancy weight. Lowest weight was 175 lbs post son's birth.  The following learning objectives were met by the patient during this course:   States the definition of Gestational Diabetes  States why dietary management is important in controlling blood glucose  Describes the effects each nutrient has on blood glucose levels  Demonstrates ability to create a balanced meal plan  Demonstrates carbohydrate counting   States when to check blood glucose levels  Demonstrates proper blood glucose monitoring techniques  States the effect of stress and exercise on blood glucose levels  States the importance of limiting caffeine and abstaining from alcohol and smoking  Blood glucose monitor given: Accu-Chek Guide Me Lot # [id number] Exp: 11/30/2021 Blood glucose reading: 121 post smoothie (homemade)  Patient instructed to monitor glucose levels: FBS: 60 - <90 1 hour: <140 2 hour: <120  *Patient received handouts:  Nutrition Diabetes and Pregnancy  Carbohydrate Counting List  Patient will be seen for follow-up as needed.   Recommended patient follow up post delivery to further discuss blood glucose and lifestyle change.  Patient can also call anytime for questions or concerns.

## 2020-12-26 ENCOUNTER — Other Ambulatory Visit: Payer: Self-pay | Admitting: Obstetrics and Gynecology

## 2020-12-26 DIAGNOSIS — Z3A19 19 weeks gestation of pregnancy: Secondary | ICD-10-CM

## 2020-12-26 DIAGNOSIS — Z363 Encounter for antenatal screening for malformations: Secondary | ICD-10-CM

## 2020-12-26 DIAGNOSIS — O09522 Supervision of elderly multigravida, second trimester: Secondary | ICD-10-CM

## 2020-12-26 DIAGNOSIS — O24419 Gestational diabetes mellitus in pregnancy, unspecified control: Secondary | ICD-10-CM

## 2021-01-13 ENCOUNTER — Other Ambulatory Visit: Payer: Self-pay | Admitting: *Deleted

## 2021-01-13 ENCOUNTER — Ambulatory Visit: Payer: 59 | Attending: Obstetrics and Gynecology

## 2021-01-13 ENCOUNTER — Ambulatory Visit: Payer: 59 | Admitting: *Deleted

## 2021-01-13 ENCOUNTER — Encounter: Payer: Self-pay | Admitting: *Deleted

## 2021-01-13 ENCOUNTER — Other Ambulatory Visit: Payer: Self-pay

## 2021-01-13 VITALS — BP 111/64 | HR 89

## 2021-01-13 DIAGNOSIS — Z3A19 19 weeks gestation of pregnancy: Secondary | ICD-10-CM | POA: Diagnosis present

## 2021-01-13 DIAGNOSIS — O24419 Gestational diabetes mellitus in pregnancy, unspecified control: Secondary | ICD-10-CM | POA: Diagnosis present

## 2021-01-13 DIAGNOSIS — Z363 Encounter for antenatal screening for malformations: Secondary | ICD-10-CM | POA: Insufficient documentation

## 2021-01-13 DIAGNOSIS — O09522 Supervision of elderly multigravida, second trimester: Secondary | ICD-10-CM | POA: Insufficient documentation

## 2021-01-13 DIAGNOSIS — O24415 Gestational diabetes mellitus in pregnancy, controlled by oral hypoglycemic drugs: Secondary | ICD-10-CM

## 2021-01-23 ENCOUNTER — Other Ambulatory Visit: Payer: Self-pay

## 2021-01-23 ENCOUNTER — Encounter (HOSPITAL_BASED_OUTPATIENT_CLINIC_OR_DEPARTMENT_OTHER): Payer: Self-pay | Admitting: *Deleted

## 2021-01-23 ENCOUNTER — Emergency Department (HOSPITAL_BASED_OUTPATIENT_CLINIC_OR_DEPARTMENT_OTHER)
Admission: EM | Admit: 2021-01-23 | Discharge: 2021-01-23 | Disposition: A | Discharge status: Home / Self Care | Payer: 59 | Attending: Emergency Medicine | Admitting: Emergency Medicine

## 2021-01-23 DIAGNOSIS — O26899 Other specified pregnancy related conditions, unspecified trimester: Secondary | ICD-10-CM | POA: Insufficient documentation

## 2021-01-23 DIAGNOSIS — O24415 Gestational diabetes mellitus in pregnancy, controlled by oral hypoglycemic drugs: Secondary | ICD-10-CM | POA: Insufficient documentation

## 2021-01-23 DIAGNOSIS — R519 Headache, unspecified: Secondary | ICD-10-CM | POA: Insufficient documentation

## 2021-01-23 DIAGNOSIS — Z7984 Long term (current) use of oral hypoglycemic drugs: Secondary | ICD-10-CM | POA: Insufficient documentation

## 2021-01-23 DIAGNOSIS — R110 Nausea: Secondary | ICD-10-CM | POA: Insufficient documentation

## 2021-01-23 DIAGNOSIS — Z3A00 Weeks of gestation of pregnancy not specified: Secondary | ICD-10-CM | POA: Insufficient documentation

## 2021-01-23 DIAGNOSIS — H5789 Other specified disorders of eye and adnexa: Secondary | ICD-10-CM | POA: Diagnosis not present

## 2021-01-23 MED ORDER — ACETAMINOPHEN 325 MG PO TABS
ORAL_TABLET | ORAL | Status: AC
  Filled 2021-01-23: qty 2

## 2021-01-23 MED ORDER — DIPHENHYDRAMINE HCL 50 MG/ML IJ SOLN
25.0000 mg | Freq: Once | INTRAMUSCULAR | Status: AC
  Administered 2021-01-23: 25 mg via INTRAVENOUS
  Filled 2021-01-23: qty 1

## 2021-01-23 MED ORDER — PROCHLORPERAZINE EDISYLATE 10 MG/2ML IJ SOLN
10.0000 mg | Freq: Once | INTRAMUSCULAR | Status: AC
  Administered 2021-01-23: 10 mg via INTRAVENOUS
  Filled 2021-01-23: qty 2

## 2021-01-23 MED ORDER — ACETAMINOPHEN 325 MG PO TABS
650.0000 mg | ORAL_TABLET | Freq: Once | ORAL | Status: AC
  Administered 2021-01-23: 650 mg via ORAL
  Filled 2021-01-23: qty 2

## 2021-01-23 MED ORDER — SODIUM CHLORIDE 0.9 % IV BOLUS
500.0000 mL | Freq: Once | INTRAVENOUS | Status: AC
  Administered 2021-01-23: 500 mL via INTRAVENOUS

## 2021-01-23 NOTE — Discharge Instructions (Addendum)
Suspect you are suffering from a migraine versus a cluster headache.  I recommend over-the-counter pain medication like Tylenol every 6 hour as needed please follow dosing on the back of bottle.  Also given information about migraines please read for further information.  I would like you to follow-up with neurology or your OB/GYN for further evaluation.  Come back to the emergency department if you develop chest pain, shortness of breath, severe abdominal pain, uncontrolled nausea, vomiting, diarrhea.

## 2021-01-23 NOTE — ED Triage Notes (Addendum)
Headache x 3 days. Sharp constant pain. Pt is pregnant with no pregnancy issues.

## 2021-01-23 NOTE — ED Provider Notes (Signed)
[facility] EMERGENCY DEPARTMENT Provider Note   CSN: [id number] Arrival date & time: 01/23/21  1600     History Chief Complaint  Patient presents with  . Headache    Laura Sexton is a 35 y.o. female.  HPI   Patient with significant medical history of headaches, prediabetes presents with chief complaint of headaches.  She endorses headaches been going on since Wednesday, describes the headache as a pressure-like sensation which she feels on the left side of her face, mainly in her eyes and then radiates down into her left neck, is associated with slight nausea without vomiting, sensitive to light and noise, denies paresthesias or weakness in the upper or lower extremities, denies visual changes.  She has had no trauma to the head, not on anticoagulant, she has no associated symptoms like fevers or chills, denies IV drug use.  She has been taking Tylenol without  relief, states typically her headaches go away after Tylenol but this has been unrelenting.  Patient is currently pregnant, she denies abdominal pain, vaginal discharge, vaginal bleeding, denies any trauma to the abdomen, patient denies fevers, chills, chest pain, shortness of breath, abdominal pain, nausea, vomit, diarrhea, worsening pedal edema.  Past Medical History:  Diagnosis Date  . GDM (gestational diabetes mellitus) 2016  . Headache   . History of gestational diabetes   . History of pregnancy induced hypertension   . Hyperlipidemia   . IUD migration   . Prediabetes 2020  . SUI (stress urinary incontinence, female)   . Wears contact lenses     Patient Active Problem List   Diagnosis Date Noted  . Gestational diabetes mellitus (GDM) controlled on oral hypoglycemic drug 08/11/2018  . Abnormal glucose tolerance test (GTT) during pregnancy, antepartum 03/15/2018  . Postpartum state 01/18/2015  . Indication for care in labor or delivery 01/17/2015  . GDM (gestational diabetes mellitus)   . [redacted] weeks  gestation of pregnancy   . Positive pregnancy test 06/03/2014  . Abnormal finding on urinalysis 06/03/2014  . Overweight 06/30/2013  . Obesity, unspecified 05/14/2013  . Leg swelling 01/21/2013  . Screening for malignant neoplasm of the cervix 01/21/2013  . Routine general medical examination at a health care facility 01/21/2013    Past Surgical History:  Procedure Laterality Date  . CHOLECYSTECTOMY N/A 06/18/2015   Procedure: LAPAROSCOPIC CHOLECYSTECTOMY WITH INTRAOPERATIVE CHOLANGIOGRAM;  Surgeon: Manus Rudd, MD;  Location: [facility] OR;  Service: General;  Laterality: N/A;  . HAND SURGERY  age 64   Cyst on Left Thumb-  . HYSTEROSCOPY N/A 06/15/2019   Procedure: HYSTEROSCOPY;  Surgeon: Carrington Clamp, MD;  Location: [facility];  Service: Gynecology;  Laterality: N/A;  . LAPAROSCOPY N/A 06/15/2019   Procedure: DIAGNOSTIC LAPAROSCOPY, LAPAROSCOPIC REMOVAL OF INTRAUTERINE DEVICE;  Surgeon: Carrington Clamp, MD;  Location: [facility] ;  Service: Gynecology;  Laterality: N/A;     OB History    Gravida  3   Para  2   Term  2   Preterm      AB      Living  2     SAB      IAB      Ectopic      Multiple  0   Live Births  2           Family History  Problem Relation Age of Onset  . Hypertension Mother        Living  . Hyperlipidemia Mother   . Diabetes Mother   .  Hyperlipidemia Father        Living  . Hearing loss Father   . Heart disease Father 60       CABG x 3  . Glaucoma Father   . Asthma Brother        Childhood, resolved  . Cancer Paternal Uncle   . Heart disease Maternal Grandmother   . Hyperlipidemia Maternal Grandmother   . Hepatitis Maternal Grandmother   . Heart disease Maternal Grandfather   . Hyperlipidemia Maternal Grandfather   . Heart disease Paternal Grandmother   . Hyperlipidemia Paternal Grandmother   . Heart disease Paternal Grandfather   . Hyperlipidemia Paternal Grandfather   . Lymphoma Paternal Aunt   .  Healthy Sister        x2    Social History   Tobacco Use  . Smoking status: Never Smoker  . Smokeless tobacco: Never Used  Vaping Use  . Vaping Use: Never used  Substance Use Topics  . Alcohol use: No  . Drug use: Never    Home Medications Prior to Admission medications   Medication Sig Start Date End Date Taking? Authorizing Provider  metFORMIN (GLUCOPHAGE) 500 MG tablet metformin 500 mg tablet  Take 1 tablet twice a day by oral route.   Yes [provider]  Prenatal Vit-Fe Fumarate-FA (PRENATAL MULTIVITAMIN) TABS tablet Take 1 tablet by mouth daily.    Yes [provider]  norethindrone (MICRONOR) 0.35 MG tablet Take 1 tablet by mouth at bedtime. Patient not taking: Reported on 11/24/2020    [provider]  oxyCODONE-acetaminophen (PERCOCET/ROXICET) 5-325 MG tablet Take 1 tablet by mouth every 4 (four) hours as needed for severe pain. Patient not taking: Reported on 11/24/2020 06/15/19   Carrington Clamp, MD    Allergies    Patient has no known allergies.  Review of Systems   Review of Systems  Constitutional: Negative for chills and fever.  HENT: Negative for congestion.   Respiratory: Negative for shortness of breath.   Cardiovascular: Negative for chest pain.  Gastrointestinal: Positive for nausea. Negative for abdominal pain and vomiting.  Genitourinary: Negative for enuresis.  Musculoskeletal: Negative for back pain.  Skin: Negative for rash.  Neurological: Positive for headaches. Negative for dizziness.  Hematological: Does not bruise/bleed easily.    Physical Exam Updated Vital Signs BP 110/73   Pulse 92   Temp (!) 97.5 F (36.4 C) (Oral)   Resp 18   Ht 5\' 6"  (1.676 m)   Wt 96.2 kg   LMP 08/23/2020   SpO2 100%   BMI 34.23 kg/m   Physical Exam Vitals and nursing note reviewed.  Constitutional:      General: She is not in acute distress.    Appearance: She is not ill-appearing.  HENT:     Head: Normocephalic and  atraumatic.     Nose: No congestion.  Eyes:     Extraocular Movements: Extraocular movements intact.     Conjunctiva/sclera: Conjunctivae normal.     Pupils: Pupils are equal, round, and reactive to light.  Cardiovascular:     Rate and Rhythm: Normal rate and regular rhythm.     Pulses: Normal pulses.     Heart sounds: No murmur heard. No friction rub. No gallop.   Pulmonary:     Effort: No respiratory distress.     Breath sounds: No wheezing, rhonchi or rales.  Musculoskeletal:     Cervical back: No rigidity.     Right lower leg: No edema.  Left lower leg: No edema.  Skin:    General: Skin is warm and dry.  Neurological:     Mental Status: She is alert.     GCS: GCS eye subscore is 4. GCS verbal subscore is 5. GCS motor subscore is 6.     Cranial Nerves: No facial asymmetry.     Sensory: Sensation is intact.     Motor: No weakness.     Coordination: Romberg sign negative. Finger-Nose-Finger Test normal.     Comments: Cranial nerves II through XII grossly intact  Patient notably word finding.  Psychiatric:        Mood and Affect: Mood normal.     ED Results / Procedures / Treatments   Labs (all labs ordered are listed, but only abnormal results are displayed) Labs Reviewed - No data to display  EKG None  Radiology No results found.  Procedures Procedures   Medications Ordered in ED Medications  prochlorperazine (COMPAZINE) injection 10 mg (10 mg Intravenous Given 01/23/21 1750)  diphenhydrAMINE (BENADRYL) injection 25 mg (25 mg Intravenous Given 01/23/21 1750)  sodium chloride 0.9 % bolus 500 mL (0 mLs Intravenous Stopped 01/23/21 1848)  acetaminophen (TYLENOL) tablet 650 mg (650 mg Oral Given 01/23/21 1747)    ED Course  I have reviewed the triage vital signs and the nursing notes.  Pertinent labs & imaging results that were available during my care of the patient were reviewed by me and considered in my medical decision making (see chart for details).     MDM Rules/Calculators/A&P                         Initial impression-patient presents emerged part chief complaint of headaches.  She is alert, does not appear in acute distress and vital signs reassuring.  Symptoms consistent with a migraine, will provide patient with a migraine cocktail and reassess.  Work-up-due to well-appearing patient, benign physical exam further lab work and imaging not warranted at this time.  Reassessment patient is reassessed, states she is feeling better, headache has improved, vital signs remained stable.  Will DC patient upon completion of IV fluids and if headache continues to improve  Rule out-low suspicion for intracranial head bleeding or mass as there is no neuro deficits on my exam.  Headache improved with migraine cocktail.  Low suspicion for systemic infection as patient is nontoxic-appearing, vital signs reassuring.  Low suspicion for meningitis as patient has no meningeal sign, has low risk factors.  Plan-patient has a headache, suspect may be migraine versus cluster headache.  Will recommend over-the-counter pain medications, follow-up with neurology for further evaluation.  Vital signs have remained stable, no indication for hospital admission.  Patient discussed with attending and they agreed with assessment and plan.  Patient given at home care as well strict return precautions.  Patient verbalized that they understood agreed to said plan.   Final Clinical Impression(s) / ED Diagnoses Final diagnoses:  Bad headache    Rx / DC Orders ED Discharge Orders    None       Carroll Sage, PA-C 01/23/21 1903    Melene Plan, DO 01/23/21 1916

## 2021-02-05 ENCOUNTER — Telehealth: Payer: Self-pay

## 2021-02-05 NOTE — Telephone Encounter (Signed)
Left vm for patient to call and reschedule her appointment due to [place] opening late on 02/10/21. Left our contact information for her to call back.

## 2021-02-10 ENCOUNTER — Encounter: Payer: Self-pay | Admitting: *Deleted

## 2021-02-10 ENCOUNTER — Ambulatory Visit: Payer: 59 | Attending: Maternal & Fetal Medicine

## 2021-02-10 ENCOUNTER — Ambulatory Visit: Payer: 59 | Admitting: *Deleted

## 2021-02-10 ENCOUNTER — Other Ambulatory Visit: Payer: Self-pay

## 2021-02-10 ENCOUNTER — Other Ambulatory Visit: Payer: Self-pay | Admitting: *Deleted

## 2021-02-10 VITALS — BP 92/56 | HR 85

## 2021-02-10 DIAGNOSIS — Z362 Encounter for other antenatal screening follow-up: Secondary | ICD-10-CM | POA: Diagnosis not present

## 2021-02-10 DIAGNOSIS — O24415 Gestational diabetes mellitus in pregnancy, controlled by oral hypoglycemic drugs: Secondary | ICD-10-CM

## 2021-02-10 DIAGNOSIS — O09522 Supervision of elderly multigravida, second trimester: Secondary | ICD-10-CM

## 2021-02-10 DIAGNOSIS — Z3A24 24 weeks gestation of pregnancy: Secondary | ICD-10-CM

## 2021-02-10 DIAGNOSIS — O99212 Obesity complicating pregnancy, second trimester: Secondary | ICD-10-CM | POA: Diagnosis not present

## 2021-02-10 DIAGNOSIS — E669 Obesity, unspecified: Secondary | ICD-10-CM

## 2021-02-13 ENCOUNTER — Encounter (HOSPITAL_BASED_OUTPATIENT_CLINIC_OR_DEPARTMENT_OTHER): Payer: Self-pay

## 2021-02-13 ENCOUNTER — Emergency Department (HOSPITAL_BASED_OUTPATIENT_CLINIC_OR_DEPARTMENT_OTHER)
Admission: EM | Admit: 2021-02-13 | Discharge: 2021-02-14 | Disposition: A | Discharge status: Home / Self Care | Payer: 59 | Attending: Emergency Medicine | Admitting: Emergency Medicine

## 2021-02-13 ENCOUNTER — Emergency Department (HOSPITAL_BASED_OUTPATIENT_CLINIC_OR_DEPARTMENT_OTHER): Payer: 59

## 2021-02-13 ENCOUNTER — Other Ambulatory Visit: Payer: Self-pay

## 2021-02-13 DIAGNOSIS — Z20822 Contact with and (suspected) exposure to covid-19: Secondary | ICD-10-CM | POA: Insufficient documentation

## 2021-02-13 DIAGNOSIS — O99512 Diseases of the respiratory system complicating pregnancy, second trimester: Secondary | ICD-10-CM | POA: Diagnosis present

## 2021-02-13 DIAGNOSIS — O2652 Maternal hypotension syndrome, second trimester: Secondary | ICD-10-CM | POA: Insufficient documentation

## 2021-02-13 DIAGNOSIS — R000 Tachycardia, unspecified: Secondary | ICD-10-CM | POA: Diagnosis not present

## 2021-02-13 DIAGNOSIS — J181 Lobar pneumonia, unspecified organism: Secondary | ICD-10-CM | POA: Diagnosis not present

## 2021-02-13 DIAGNOSIS — Z3A22 22 weeks gestation of pregnancy: Secondary | ICD-10-CM | POA: Insufficient documentation

## 2021-02-13 DIAGNOSIS — Z3A24 24 weeks gestation of pregnancy: Secondary | ICD-10-CM

## 2021-02-13 DIAGNOSIS — J189 Pneumonia, unspecified organism: Secondary | ICD-10-CM

## 2021-02-13 LAB — RESP PANEL BY RT-PCR (FLU A&B, COVID) ARPGX2
Influenza A by PCR: NEGATIVE
Influenza B by PCR: NEGATIVE
SARS Coronavirus 2 by RT PCR: NEGATIVE

## 2021-02-13 MED ORDER — AZITHROMYCIN 250 MG PO TABS
500.0000 mg | ORAL_TABLET | Freq: Once | ORAL | Status: AC
  Administered 2021-02-13: 500 mg via ORAL
  Filled 2021-02-13: qty 2

## 2021-02-13 MED ORDER — AZITHROMYCIN 250 MG PO TABS: 250.0000 mg | ORAL_TABLET | Freq: Every day | ORAL | 0 refills | Status: DC

## 2021-02-13 MED ORDER — AMOXICILLIN 500 MG PO CAPS
500.0000 mg | ORAL_CAPSULE | Freq: Once | ORAL | Status: AC
  Administered 2021-02-13: 500 mg via ORAL
  Filled 2021-02-13: qty 1

## 2021-02-13 MED ORDER — ALBUTEROL SULFATE HFA 108 (90 BASE) MCG/ACT IN AERS
2.0000 | INHALATION_SPRAY | RESPIRATORY_TRACT | Status: DC | PRN
  Administered 2021-02-14: 2 via RESPIRATORY_TRACT

## 2021-02-13 MED ORDER — AMOXICILLIN 500 MG PO CAPS: 1000.0000 mg | ORAL_CAPSULE | Freq: Three times a day (TID) | ORAL | 0 refills | Status: AC

## 2021-02-13 MED ORDER — ACETAMINOPHEN 325 MG PO TABS
650.0000 mg | ORAL_TABLET | Freq: Once | ORAL | Status: AC
  Administered 2021-02-13: 650 mg via ORAL
  Filled 2021-02-13: qty 2

## 2021-02-13 NOTE — ED Triage Notes (Signed)
Pt c/o flu like sx x 3 days-states she is 24 weeks 6 days pregnant-NAD-steady gait

## 2021-02-13 NOTE — ED Provider Notes (Signed)
[facility] EMERGENCY DEPARTMENT Provider Note   CSN: [id number] Arrival date & time: 02/13/21  1958     History Chief Complaint  Patient presents with  . Cough    Laura Sexton is a 35 y.o. female.  Patient with history of gestational diabetes who is currently at 24 weeks and 6 days of pregnancy presents the emergency department for cough which has been ongoing over the past 3 days.  She has also had a sore throat and a temperature close to 100 F during this time.  She denies any sick contacts.  No ear pain, runny nose.  She has had some shortness of breath with exertion.  She has a burning sensation in her chest she relates to frequent coughing.  No vomiting or diarrhea.  No history of lung problems.  Onset of symptoms acute.  Course is constant.  Nothing has really improved symptoms.        Past Medical History:  Diagnosis Date  . GDM (gestational diabetes mellitus) 2016  . Headache   . History of gestational diabetes   . History of pregnancy induced hypertension   . Hyperlipidemia   . IUD migration   . Prediabetes 2020  . SUI (stress urinary incontinence, female)   . Wears contact lenses     Patient Active Problem List   Diagnosis Date Noted  . Gestational diabetes mellitus (GDM) controlled on oral hypoglycemic drug 08/11/2018  . Abnormal glucose tolerance test (GTT) during pregnancy, antepartum 03/15/2018  . Postpartum state 01/18/2015  . Indication for care in labor or delivery 01/17/2015  . GDM (gestational diabetes mellitus)   . [redacted] weeks gestation of pregnancy   . Positive pregnancy test 06/03/2014  . Abnormal finding on urinalysis 06/03/2014  . Overweight 06/30/2013  . Obesity, unspecified 05/14/2013  . Leg swelling 01/21/2013  . Screening for malignant neoplasm of the cervix 01/21/2013  . Routine general medical examination at a health care facility 01/21/2013    Past Surgical History:  Procedure Laterality Date  . CHOLECYSTECTOMY N/A  06/18/2015   Procedure: LAPAROSCOPIC CHOLECYSTECTOMY WITH INTRAOPERATIVE CHOLANGIOGRAM;  Surgeon: Manus RuddMatthew Tsuei, MD;  Location: [facility] OR;  Service: General;  Laterality: N/A;  . HAND SURGERY  age 73   Cyst on Left Thumb-  . HYSTEROSCOPY N/A 06/15/2019   Procedure: HYSTEROSCOPY;  Surgeon: Carrington ClampHorvath, Michelle, MD;  [facility];  Service: Gynecology;  Laterality: N/A;  . LAPAROSCOPY N/A 06/15/2019   Procedure: DIAGNOSTIC LAPAROSCOPY, LAPAROSCOPIC REMOVAL OF INTRAUTERINE DEVICE;  Surgeon: Carrington ClampHorvath, Michelle, MD;  [facility];  Service: Gynecology;  Laterality: N/A;     OB History    Gravida  3   Para  2   Term  2   Preterm      AB      Living  2     SAB      IAB      Ectopic      Multiple  0   Live Births  2           Family History  Problem Relation Age of Onset  . Hypertension Mother        Living  . Hyperlipidemia Mother   . Diabetes Mother   . Hyperlipidemia Father        Living  . Hearing loss Father   . Heart disease Father [id number]       CABG x 3  . Glaucoma Father   . Asthma Brother  Childhood, resolved  . Cancer Paternal Uncle   . Heart disease Maternal Grandmother   . Hyperlipidemia Maternal Grandmother   . Hepatitis Maternal Grandmother   . Heart disease Maternal Grandfather   . Hyperlipidemia Maternal Grandfather   . Heart disease Paternal Grandmother   . Hyperlipidemia Paternal Grandmother   . Heart disease Paternal Grandfather   . Hyperlipidemia Paternal Grandfather   . Lymphoma Paternal Aunt   . Healthy Sister        x2    Social History   Tobacco Use  . Smoking status: Never Smoker  . Smokeless tobacco: Never Used  Vaping Use  . Vaping Use: Never used  Substance Use Topics  . Alcohol use: No  . Drug use: Never    Home Medications Prior to Admission medications   Medication Sig Start Date End Date Taking? Authorizing Provider  metFORMIN (GLUCOPHAGE) 500 MG tablet metformin 500 mg  tablet  Take 1 tablet twice a day by oral route.    [provider]  norethindrone (MICRONOR) 0.35 MG tablet Take 1 tablet by mouth at bedtime. Patient not taking: Reported on 11/24/2020    [provider]  oxyCODONE-acetaminophen (PERCOCET/ROXICET) 5-325 MG tablet Take 1 tablet by mouth every 4 (four) hours as needed for severe pain. Patient not taking: Reported on 11/24/2020 06/15/19   Carrington Clamp, MD  Prenatal Vit-Fe Fumarate-FA (PRENATAL MULTIVITAMIN) TABS tablet Take 1 tablet by mouth daily.     [provider]    Allergies    Patient has no known allergies.  Review of Systems   Review of Systems  Constitutional: Positive for fatigue (for about a week) and fever.  HENT: Positive for sore throat. Negative for rhinorrhea.   Eyes: Negative for redness.  Respiratory: Positive for cough, chest tightness and shortness of breath.   Cardiovascular: Negative for leg swelling.  Gastrointestinal: Negative for abdominal pain, diarrhea, nausea and vomiting.  Genitourinary: Negative for dysuria, frequency, hematuria and urgency.  Musculoskeletal: Negative for myalgias.  Skin: Negative for rash.  Neurological: Negative for headaches.    Physical Exam Updated Vital Signs BP 106/73 (BP Location: Right Arm)   Pulse (!) 115   Temp 98.2 F (36.8 C) (Oral)   Resp 20   Ht 5\' 6"  (1.676 m)   Wt 97.1 kg   LMP 08/23/2020   SpO2 100%   BMI 34.54 kg/m   Physical Exam Vitals and nursing note reviewed.  Constitutional:      Appearance: She is well-developed.  HENT:     Head: Normocephalic and atraumatic.     Jaw: No trismus.     Right Ear: Tympanic membrane, ear canal and external ear normal.     Left Ear: Tympanic membrane, ear canal and external ear normal.     Nose: Nose normal. No mucosal edema or rhinorrhea.     Mouth/Throat:     Mouth: Mucous membranes are not dry. No oral lesions.     Pharynx: Uvula midline. Posterior oropharyngeal erythema present. No  oropharyngeal exudate or uvula swelling.     Tonsils: No tonsillar abscesses.  Eyes:     General:        Right eye: No discharge.        Left eye: No discharge.     Conjunctiva/sclera: Conjunctivae normal.  Cardiovascular:     Rate and Rhythm: Regular rhythm. Tachycardia present.     Heart sounds: Normal heart sounds.  Pulmonary:     Effort: Pulmonary effort is normal. No respiratory  distress.     Breath sounds: Normal breath sounds. No wheezing or rales.     Comments: Frequent coughing during exam.  Lungs are clear to auscultation bilaterally. Abdominal:     Palpations: Abdomen is soft.     Tenderness: There is no abdominal tenderness.  Musculoskeletal:     Cervical back: Normal range of motion and neck supple.  Lymphadenopathy:     Cervical: No cervical adenopathy.  Skin:    General: Skin is warm and dry.  Neurological:     Mental Status: She is alert.     ED Results / Procedures / Treatments   Labs (all labs ordered are listed, but only abnormal results are displayed) Labs Reviewed  RESP PANEL BY RT-PCR (FLU A&B, COVID) [id number]    EKG None  Radiology DG Chest [facility] 1 View  Result Date: 02/13/2021 CLINICAL DATA:  Flu like symptoms x3 days. EXAM: PORTABLE CHEST 1 VIEW COMPARISON:  None. FINDINGS: Mild atelectasis and/or infiltrate is seen within the right lung base. The heart size and mediastinal contours are within normal limits. The visualized skeletal structures are unremarkable. IMPRESSION: Mild right basilar atelectasis and/or infiltrate. Electronically Signed   By: Aram Candela M.D.   On: 02/13/2021 20:56    Procedures Procedures   Medications Ordered in ED Medications  albuterol (VENTOLIN HFA) 108 (90 Base) MCG/ACT inhaler 2 puff (has no administration in time range)  acetaminophen (TYLENOL) tablet 650 mg (650 mg Oral Given 02/13/21 2156)  azithromycin (ZITHROMAX) tablet 500 mg (500 mg Oral Given 02/13/21 2311)  amoxicillin (AMOXIL) capsule 500 mg (500 mg  Oral Given 02/13/21 2311)    ED Course  I have reviewed the triage vital signs and the nursing notes.  Pertinent labs & imaging results that were available during my care of the patient were reviewed by me and considered in my medical decision making (see chart for details).  Patient seen and examined. Work-up initiated.  She looks well.  Given reported fevers, will check chest x-ray, COVID/flu testing.  Vital signs reviewed and are as follows: BP 106/73 (BP Location: Right Arm)   Pulse (!) 115   Temp 98.2 F (36.8 C) (Oral)   Resp 20   Ht 5\' 6"  (1.676 m)   Wt 97.1 kg   LMP 08/23/2020   SpO2 100%   BMI 34.54 kg/m   CXR with possibility of PNA.  COVID/flu testing was negative.  11:02 PM Patient discussed with Dr. 08/25/2020 who has seen patient.  We will give some Tylenol.  Heart remains mildly elevated and blood pressures have remained soft, however patient states they have been low during recent ultrasounds and they had to check her blood pressure several times.  At this point we have low concern for sepsis.  Plan to give community-acquired pneumonia treatment with amoxicillin and azithromycin.  Will give albuterol inhaler.  Will reassess shortly.  11:46 PM patient rechecked.  Heart rate 100 upon entering the room.  She has not hypoxic.  She states that she is feeling okay, she feels a bit flushed.  We discussed plan as above.  Patient encouraged to take antibiotics as prescribed.  She has albuterol inhaler to use for home.  We discussed signs and symptoms which should cause her to return including lightheadedness or syncope, high fever, worsening shortness of breath or trouble breathing.  She states that she is comfortable with going home and will let her OB/GYN know of her infection.    MDM Rules/Calculators/A&P  Cough/low grade fever/sore throat: Concern for infection.  Patient does not appear septic.  No fever here and no distressed breathing.  Chest x-ray is  concerning for right lower lobe pneumonia.  COVID and flu tests are negative.  Patient has not been hypoxic.  Patient discussed with and seen by Dr. Clarene Duke.  Patient will be started on pneumonia treatment.  Will be given albuterol inhaler to try to help with cough.  Hypotension: Patient states that she has been having hypotension during recent visits for pregnancy including ultrasounds.  This preceded her current illness.  Feel that this is most likely due to her ongoing pregnancy.  She is not having any significant lightheadedness or syncope.  She will continue to have this monitored by her OB/GYN.  Again, do not suspect that patient is septic based on appearance and exam.    Final Clinical Impression(s) / ED Diagnoses Final diagnoses:  Community acquired pneumonia of right lower lobe of lung  [redacted] weeks gestation of pregnancy  Maternal hypotension syndrome in second trimester    Rx / DC Orders ED Discharge Orders         Ordered    amoxicillin (AMOXIL) 500 MG capsule  3 times daily        02/13/21 2349    azithromycin (ZITHROMAX) 250 MG tablet  Daily        02/13/21 2349           Renne Crigler, PA-C 02/14/21 0001    Little, Ambrose Finland, MD 02/16/21 [phone]

## 2021-02-13 NOTE — Progress Notes (Signed)
[facility] RN called [facility] RN at 2022 to notify of a patient who presented with a cough,  [redacted]w[redacted]d gestation.  No OB complaints. [facility] instructed RN to obtain a fetal heart rate via doppler and to call back. RN called back and reports fetal heart rate of 160.  [facility] called Dr.Clark at 2039 and notified of patient. Dr.Clark requests no further OB assessment.

## 2021-02-13 NOTE — ED Notes (Signed)
[facility] called to see if pt needs toco. Not needed at this time, [facility] Nurse requested a call back with fetal HR (160) called and left Msg.

## 2021-02-14 DIAGNOSIS — O99512 Diseases of the respiratory system complicating pregnancy, second trimester: Secondary | ICD-10-CM | POA: Diagnosis not present

## 2021-02-14 NOTE — Discharge Instructions (Signed)
Please read and follow all provided instructions.  Your diagnoses today include:  1. Community acquired pneumonia of right lower lobe of lung   2. [redacted] weeks gestation of pregnancy   3. Maternal hypotension syndrome in second trimester     Tests performed today include:  Chest x-ray -- shows pneumonia in the right lower lobe  Covid/flu test - were negative  Vital signs. See below for your results today.   Medications prescribed:   Amoxicillin - antibiotic  You have been prescribed an antibiotic medicine: take the entire course of medicine even if you are feeling better. Stopping early can cause the antibiotic not to work.   Azithromycin - antibiotic for respiratory infection  You have been prescribed an antibiotic medicine: take the entire course of medicine even if you are feeling better. Stopping early can cause the antibiotic not to work.   Albuterol inhaler - medication that opens up your airway  Use inhaler as follows: 1-2 puffs with spacer every 4 hours as needed for wheezing, cough, or shortness of breath.   Take any prescribed medications only as directed.  Home care instructions:  Follow any educational materials contained in this packet.  Take the complete course of antibiotics that you were prescribed.   BE VERY CAREFUL not to take multiple medicines containing Tylenol (also called acetaminophen). Doing so can lead to an overdose which can damage your liver and cause liver failure and possibly death.   Follow-up instructions: Please call your OB/GYN on Monday and let them know what is going on.  Return instructions:   Please return to the Emergency Department if you experience worsening symptoms.   Return immediately with worsening breathing, worsening shortness of breath, or if you feel it is taking you more effort to breathe.   Please return if you feel lightheaded or pass out.  Please return if you have any other emergent concerns.  Additional  Information:  Your vital signs today were: BP 100/67 (BP Location: Right Arm)   Pulse (!) 104   Temp 98.2 F (36.8 C) (Oral)   Resp 18   Ht 5\' 6"  (1.676 m)   Wt 97.1 kg   LMP 08/23/2020   SpO2 97%   BMI 34.54 kg/m  If your blood pressure (BP) was elevated above 135/85 this visit, please have this repeated by your doctor within one month. --------------

## 2021-03-10 ENCOUNTER — Other Ambulatory Visit: Payer: Self-pay

## 2021-03-10 ENCOUNTER — Encounter: Payer: Self-pay | Admitting: *Deleted

## 2021-03-10 ENCOUNTER — Other Ambulatory Visit: Payer: Self-pay | Admitting: *Deleted

## 2021-03-10 ENCOUNTER — Ambulatory Visit: Payer: 59 | Attending: Obstetrics and Gynecology

## 2021-03-10 ENCOUNTER — Ambulatory Visit: Payer: 59 | Admitting: *Deleted

## 2021-03-10 VITALS — BP 88/52 | HR 90

## 2021-03-10 DIAGNOSIS — Z3A28 28 weeks gestation of pregnancy: Secondary | ICD-10-CM

## 2021-03-10 DIAGNOSIS — Z794 Long term (current) use of insulin: Secondary | ICD-10-CM

## 2021-03-10 DIAGNOSIS — Z362 Encounter for other antenatal screening follow-up: Secondary | ICD-10-CM

## 2021-03-10 DIAGNOSIS — E669 Obesity, unspecified: Secondary | ICD-10-CM | POA: Diagnosis not present

## 2021-03-10 DIAGNOSIS — O09523 Supervision of elderly multigravida, third trimester: Secondary | ICD-10-CM

## 2021-03-10 DIAGNOSIS — O24415 Gestational diabetes mellitus in pregnancy, controlled by oral hypoglycemic drugs: Secondary | ICD-10-CM | POA: Diagnosis present

## 2021-03-10 DIAGNOSIS — O24414 Gestational diabetes mellitus in pregnancy, insulin controlled: Secondary | ICD-10-CM | POA: Diagnosis not present

## 2021-03-10 DIAGNOSIS — O99213 Obesity complicating pregnancy, third trimester: Secondary | ICD-10-CM

## 2021-04-07 ENCOUNTER — Other Ambulatory Visit: Payer: Self-pay

## 2021-04-07 ENCOUNTER — Encounter: Payer: Self-pay | Admitting: *Deleted

## 2021-04-07 ENCOUNTER — Ambulatory Visit: Payer: 59 | Admitting: *Deleted

## 2021-04-07 ENCOUNTER — Ambulatory Visit: Payer: 59 | Attending: Obstetrics

## 2021-04-07 VITALS — BP 96/57 | HR 81

## 2021-04-07 DIAGNOSIS — Z362 Encounter for other antenatal screening follow-up: Secondary | ICD-10-CM

## 2021-04-07 DIAGNOSIS — E669 Obesity, unspecified: Secondary | ICD-10-CM | POA: Diagnosis not present

## 2021-04-07 DIAGNOSIS — O24414 Gestational diabetes mellitus in pregnancy, insulin controlled: Secondary | ICD-10-CM

## 2021-04-07 DIAGNOSIS — Z3A32 32 weeks gestation of pregnancy: Secondary | ICD-10-CM

## 2021-04-07 DIAGNOSIS — Z794 Long term (current) use of insulin: Secondary | ICD-10-CM

## 2021-04-07 DIAGNOSIS — O99213 Obesity complicating pregnancy, third trimester: Secondary | ICD-10-CM

## 2021-04-07 DIAGNOSIS — O09523 Supervision of elderly multigravida, third trimester: Secondary | ICD-10-CM

## 2021-04-07 DIAGNOSIS — O321XX0 Maternal care for breech presentation, not applicable or unspecified: Secondary | ICD-10-CM

## 2021-04-14 ENCOUNTER — Ambulatory Visit: Payer: Medicaid Other | Admitting: *Deleted

## 2021-04-14 ENCOUNTER — Ambulatory Visit: Payer: Medicaid Other | Attending: Obstetrics

## 2021-04-14 ENCOUNTER — Other Ambulatory Visit: Payer: Self-pay | Admitting: *Deleted

## 2021-04-14 ENCOUNTER — Encounter: Payer: Self-pay | Admitting: *Deleted

## 2021-04-14 ENCOUNTER — Other Ambulatory Visit: Payer: Self-pay

## 2021-04-14 VITALS — BP 98/53 | HR 88

## 2021-04-14 DIAGNOSIS — O24419 Gestational diabetes mellitus in pregnancy, unspecified control: Secondary | ICD-10-CM

## 2021-04-14 DIAGNOSIS — O09523 Supervision of elderly multigravida, third trimester: Secondary | ICD-10-CM

## 2021-04-14 DIAGNOSIS — Z362 Encounter for other antenatal screening follow-up: Secondary | ICD-10-CM

## 2021-04-14 DIAGNOSIS — Z3A33 33 weeks gestation of pregnancy: Secondary | ICD-10-CM

## 2021-04-14 DIAGNOSIS — E669 Obesity, unspecified: Secondary | ICD-10-CM

## 2021-04-14 DIAGNOSIS — O24414 Gestational diabetes mellitus in pregnancy, insulin controlled: Secondary | ICD-10-CM

## 2021-04-14 DIAGNOSIS — O99213 Obesity complicating pregnancy, third trimester: Secondary | ICD-10-CM

## 2021-04-14 DIAGNOSIS — Z794 Long term (current) use of insulin: Secondary | ICD-10-CM

## 2021-04-21 ENCOUNTER — Ambulatory Visit: Payer: Medicaid Other | Admitting: *Deleted

## 2021-04-21 ENCOUNTER — Encounter: Payer: Self-pay | Admitting: *Deleted

## 2021-04-21 ENCOUNTER — Other Ambulatory Visit: Payer: Self-pay

## 2021-04-21 ENCOUNTER — Ambulatory Visit: Payer: Medicaid Other | Attending: Obstetrics

## 2021-04-21 VITALS — BP 97/54 | HR 83

## 2021-04-21 DIAGNOSIS — O09523 Supervision of elderly multigravida, third trimester: Secondary | ICD-10-CM

## 2021-04-21 DIAGNOSIS — O24414 Gestational diabetes mellitus in pregnancy, insulin controlled: Secondary | ICD-10-CM

## 2021-04-21 DIAGNOSIS — Z362 Encounter for other antenatal screening follow-up: Secondary | ICD-10-CM | POA: Diagnosis not present

## 2021-04-21 DIAGNOSIS — O99213 Obesity complicating pregnancy, third trimester: Secondary | ICD-10-CM | POA: Diagnosis not present

## 2021-04-21 DIAGNOSIS — Z794 Long term (current) use of insulin: Secondary | ICD-10-CM

## 2021-04-21 DIAGNOSIS — Z3A34 34 weeks gestation of pregnancy: Secondary | ICD-10-CM

## 2021-04-21 DIAGNOSIS — E669 Obesity, unspecified: Secondary | ICD-10-CM

## 2021-04-28 ENCOUNTER — Other Ambulatory Visit: Payer: Self-pay | Admitting: *Deleted

## 2021-04-28 ENCOUNTER — Ambulatory Visit: Payer: Medicaid Other | Admitting: *Deleted

## 2021-04-28 ENCOUNTER — Ambulatory Visit: Payer: Medicaid Other | Attending: Obstetrics

## 2021-04-28 ENCOUNTER — Other Ambulatory Visit: Payer: Self-pay

## 2021-04-28 ENCOUNTER — Encounter: Payer: Self-pay | Admitting: *Deleted

## 2021-04-28 VITALS — BP 100/48 | HR 80

## 2021-04-28 DIAGNOSIS — O99213 Obesity complicating pregnancy, third trimester: Secondary | ICD-10-CM

## 2021-04-28 DIAGNOSIS — O24414 Gestational diabetes mellitus in pregnancy, insulin controlled: Secondary | ICD-10-CM | POA: Insufficient documentation

## 2021-04-28 DIAGNOSIS — Z362 Encounter for other antenatal screening follow-up: Secondary | ICD-10-CM

## 2021-04-28 DIAGNOSIS — E669 Obesity, unspecified: Secondary | ICD-10-CM

## 2021-04-28 DIAGNOSIS — Z3A35 35 weeks gestation of pregnancy: Secondary | ICD-10-CM

## 2021-04-28 DIAGNOSIS — Z794 Long term (current) use of insulin: Secondary | ICD-10-CM

## 2021-04-28 DIAGNOSIS — O24419 Gestational diabetes mellitus in pregnancy, unspecified control: Secondary | ICD-10-CM | POA: Insufficient documentation

## 2021-04-28 DIAGNOSIS — O09523 Supervision of elderly multigravida, third trimester: Secondary | ICD-10-CM

## 2021-05-05 ENCOUNTER — Encounter: Payer: Self-pay | Admitting: *Deleted

## 2021-05-05 ENCOUNTER — Ambulatory Visit: Payer: Medicaid Other | Attending: Obstetrics

## 2021-05-05 ENCOUNTER — Ambulatory Visit: Payer: Medicaid Other | Admitting: *Deleted

## 2021-05-05 ENCOUNTER — Other Ambulatory Visit: Payer: Self-pay

## 2021-05-05 VITALS — BP 107/65 | HR 99

## 2021-05-05 DIAGNOSIS — Z362 Encounter for other antenatal screening follow-up: Secondary | ICD-10-CM

## 2021-05-05 DIAGNOSIS — O99213 Obesity complicating pregnancy, third trimester: Secondary | ICD-10-CM | POA: Diagnosis not present

## 2021-05-05 DIAGNOSIS — O09523 Supervision of elderly multigravida, third trimester: Secondary | ICD-10-CM | POA: Insufficient documentation

## 2021-05-05 DIAGNOSIS — E669 Obesity, unspecified: Secondary | ICD-10-CM | POA: Diagnosis not present

## 2021-05-05 DIAGNOSIS — O24414 Gestational diabetes mellitus in pregnancy, insulin controlled: Secondary | ICD-10-CM

## 2021-05-05 DIAGNOSIS — Z794 Long term (current) use of insulin: Secondary | ICD-10-CM

## 2021-05-05 DIAGNOSIS — Z3A36 36 weeks gestation of pregnancy: Secondary | ICD-10-CM

## 2021-05-05 DIAGNOSIS — O24419 Gestational diabetes mellitus in pregnancy, unspecified control: Secondary | ICD-10-CM | POA: Insufficient documentation

## 2021-05-08 LAB — OB RESULTS CONSOLE GBS: GBS: NEGATIVE

## 2021-05-12 ENCOUNTER — Other Ambulatory Visit: Payer: Self-pay

## 2021-05-12 ENCOUNTER — Ambulatory Visit: Payer: Medicaid Other | Admitting: *Deleted

## 2021-05-12 ENCOUNTER — Ambulatory Visit: Payer: Medicaid Other

## 2021-05-12 ENCOUNTER — Ambulatory Visit: Payer: Medicaid Other | Attending: Obstetrics and Gynecology

## 2021-05-12 ENCOUNTER — Encounter: Payer: Self-pay | Admitting: *Deleted

## 2021-05-12 ENCOUNTER — Other Ambulatory Visit: Payer: Self-pay | Admitting: Obstetrics and Gynecology

## 2021-05-12 VITALS — BP 94/65 | HR 91

## 2021-05-12 DIAGNOSIS — E669 Obesity, unspecified: Secondary | ICD-10-CM | POA: Diagnosis not present

## 2021-05-12 DIAGNOSIS — O24414 Gestational diabetes mellitus in pregnancy, insulin controlled: Secondary | ICD-10-CM

## 2021-05-12 DIAGNOSIS — O09523 Supervision of elderly multigravida, third trimester: Secondary | ICD-10-CM

## 2021-05-12 DIAGNOSIS — Z3A37 37 weeks gestation of pregnancy: Secondary | ICD-10-CM

## 2021-05-12 DIAGNOSIS — O99213 Obesity complicating pregnancy, third trimester: Secondary | ICD-10-CM | POA: Diagnosis not present

## 2021-05-12 DIAGNOSIS — Z794 Long term (current) use of insulin: Secondary | ICD-10-CM

## 2021-05-15 ENCOUNTER — Encounter (HOSPITAL_COMMUNITY): Payer: Self-pay | Admitting: *Deleted

## 2021-05-15 ENCOUNTER — Telehealth (HOSPITAL_COMMUNITY): Payer: Self-pay | Admitting: *Deleted

## 2021-05-15 NOTE — Telephone Encounter (Signed)
Preadmission screen  

## 2021-05-19 ENCOUNTER — Other Ambulatory Visit: Payer: Self-pay | Admitting: Obstetrics and Gynecology

## 2021-05-20 ENCOUNTER — Other Ambulatory Visit: Payer: Self-pay

## 2021-05-21 LAB — SARS CORONAVIRUS 2 (TAT 6-24 HRS): SARS Coronavirus 2: NEGATIVE

## 2021-05-22 ENCOUNTER — Encounter (HOSPITAL_COMMUNITY): Payer: Self-pay | Admitting: Obstetrics and Gynecology

## 2021-05-22 ENCOUNTER — Other Ambulatory Visit: Payer: Self-pay

## 2021-05-22 ENCOUNTER — Inpatient Hospital Stay (HOSPITAL_COMMUNITY): Payer: Medicaid Other

## 2021-05-22 ENCOUNTER — Inpatient Hospital Stay (HOSPITAL_COMMUNITY): Payer: Medicaid Other | Admitting: Anesthesiology

## 2021-05-22 ENCOUNTER — Inpatient Hospital Stay (HOSPITAL_COMMUNITY)
Admission: AD | Admit: 2021-05-22 | Discharge: 2021-05-24 | DRG: 807 | Disposition: A | Discharge status: Home / Self Care | Payer: Medicaid Other | Attending: Obstetrics and Gynecology | Admitting: Obstetrics and Gynecology

## 2021-05-22 DIAGNOSIS — Z794 Long term (current) use of insulin: Secondary | ICD-10-CM

## 2021-05-22 DIAGNOSIS — O9902 Anemia complicating childbirth: Secondary | ICD-10-CM | POA: Diagnosis present

## 2021-05-22 DIAGNOSIS — O2412 Pre-existing diabetes mellitus, type 2, in childbirth: Principal | ICD-10-CM | POA: Diagnosis present

## 2021-05-22 DIAGNOSIS — Z3A38 38 weeks gestation of pregnancy: Secondary | ICD-10-CM

## 2021-05-22 DIAGNOSIS — E119 Type 2 diabetes mellitus without complications: Secondary | ICD-10-CM | POA: Diagnosis present

## 2021-05-22 DIAGNOSIS — O09523 Supervision of elderly multigravida, third trimester: Secondary | ICD-10-CM | POA: Diagnosis present

## 2021-05-22 HISTORY — DX: Pneumonia, unspecified organism: J18.9

## 2021-05-22 LAB — CBC
HCT: 30.7 % — ABNORMAL LOW (ref 36.0–46.0)
Hemoglobin: 9.7 g/dL — ABNORMAL LOW (ref 12.0–15.0)
MCH: 23.8 pg — ABNORMAL LOW (ref 26.0–34.0)
MCHC: 31.6 g/dL (ref 30.0–36.0)
MCV: 75.4 fL — ABNORMAL LOW (ref 80.0–100.0)
Platelets: 277 10*3/uL (ref 150–400)
RBC: 4.07 MIL/uL (ref 3.87–5.11)
RDW: 15.7 % — ABNORMAL HIGH (ref 11.5–15.5)
WBC: 12.9 10*3/uL — ABNORMAL HIGH (ref 4.0–10.5)
nRBC: 0 % (ref 0.0–0.2)

## 2021-05-22 LAB — GLUCOSE, CAPILLARY
Glucose-Capillary: 116 mg/dL — ABNORMAL HIGH (ref 70–99)
Glucose-Capillary: 87 mg/dL (ref 70–99)
Glucose-Capillary: 88 mg/dL (ref 70–99)

## 2021-05-22 LAB — TYPE AND SCREEN
ABO/RH(D): A POS
Antibody Screen: NEGATIVE

## 2021-05-22 MED ORDER — TERBUTALINE SULFATE 1 MG/ML IJ SOLN: 0.2500 mg | Freq: Once | INTRAMUSCULAR | Status: DC | PRN

## 2021-05-22 MED ORDER — PHENYLEPHRINE 40 MCG/ML (10ML) SYRINGE FOR IV PUSH (FOR BLOOD PRESSURE SUPPORT): 80.0000 ug | PREFILLED_SYRINGE | INTRAVENOUS | Status: DC | PRN

## 2021-05-22 MED ORDER — ONDANSETRON HCL 4 MG/2ML IJ SOLN: 4.0000 mg | Freq: Four times a day (QID) | INTRAMUSCULAR | Status: DC | PRN

## 2021-05-22 MED ORDER — FENTANYL-BUPIVACAINE-NACL 0.5-0.125-0.9 MG/250ML-% EP SOLN
EPIDURAL | Status: DC | PRN
  Administered 2021-05-22: 12 mL/h via EPIDURAL

## 2021-05-22 MED ORDER — ACETAMINOPHEN 325 MG PO TABS: 650.0000 mg | ORAL_TABLET | ORAL | Status: DC | PRN

## 2021-05-22 MED ORDER — LIDOCAINE HCL (PF) 1 % IJ SOLN
INTRAMUSCULAR | Status: DC | PRN
  Administered 2021-05-22: 5 mL via EPIDURAL

## 2021-05-22 MED ORDER — DIPHENHYDRAMINE HCL 50 MG/ML IJ SOLN: 12.5000 mg | INTRAMUSCULAR | Status: DC | PRN

## 2021-05-22 MED ORDER — LACTATED RINGERS IV SOLN
500.0000 mL | Freq: Once | INTRAVENOUS | Status: AC
  Administered 2021-05-22: 500 mL via INTRAVENOUS

## 2021-05-22 MED ORDER — FENTANYL CITRATE (PF) 100 MCG/2ML IJ SOLN
INTRAMUSCULAR | Status: AC
  Administered 2021-05-22: 100 ug
  Filled 2021-05-22: qty 2

## 2021-05-22 MED ORDER — PHENYLEPHRINE 40 MCG/ML (10ML) SYRINGE FOR IV PUSH (FOR BLOOD PRESSURE SUPPORT)
80.0000 ug | PREFILLED_SYRINGE | INTRAVENOUS | Status: DC | PRN
  Administered 2021-05-22: 80 ug via INTRAVENOUS
  Filled 2021-05-22: qty 10

## 2021-05-22 MED ORDER — SOD CITRATE-CITRIC ACID 500-334 MG/5ML PO SOLN: 30.0000 mL | ORAL | Status: DC | PRN

## 2021-05-22 MED ORDER — OXYCODONE-ACETAMINOPHEN 5-325 MG PO TABS: 1.0000 | ORAL_TABLET | ORAL | Status: DC | PRN

## 2021-05-22 MED ORDER — FENTANYL-BUPIVACAINE-NACL 0.5-0.125-0.9 MG/250ML-% EP SOLN
12.0000 mL/h | EPIDURAL | Status: DC | PRN
  Filled 2021-05-22: qty 250

## 2021-05-22 MED ORDER — AMMONIA AROMATIC IN INHA
RESPIRATORY_TRACT | Status: AC
  Filled 2021-05-22: qty 10

## 2021-05-22 MED ORDER — MISOPROSTOL 25 MCG QUARTER TABLET
25.0000 ug | ORAL_TABLET | ORAL | Status: DC | PRN
  Administered 2021-05-22: 25 ug via VAGINAL
  Filled 2021-05-22 (×2): qty 1

## 2021-05-22 MED ORDER — LACTATED RINGERS IV SOLN: INTRAVENOUS | Status: DC

## 2021-05-22 MED ORDER — OXYCODONE-ACETAMINOPHEN 5-325 MG PO TABS: 2.0000 | ORAL_TABLET | ORAL | Status: DC | PRN

## 2021-05-22 MED ORDER — EPHEDRINE 5 MG/ML INJ: 10.0000 mg | INTRAVENOUS | Status: DC | PRN

## 2021-05-22 MED ORDER — LACTATED RINGERS IV SOLN
500.0000 mL | INTRAVENOUS | Status: DC | PRN
  Administered 2021-05-22: 500 mL via INTRAVENOUS
  Administered 2021-05-22: 1000 mL via INTRAVENOUS

## 2021-05-22 MED ORDER — FENTANYL CITRATE (PF) 100 MCG/2ML IJ SOLN
INTRAMUSCULAR | Status: DC | PRN
  Administered 2021-05-22: 100 ug via EPIDURAL

## 2021-05-22 MED ORDER — BUPIVACAINE HCL (PF) 0.25 % IJ SOLN
INTRAMUSCULAR | Status: DC | PRN
  Administered 2021-05-22: 8 mL via EPIDURAL

## 2021-05-22 MED ORDER — OXYTOCIN BOLUS FROM INFUSION
333.0000 mL | Freq: Once | INTRAVENOUS | Status: AC
  Administered 2021-05-22: 333 mL via INTRAVENOUS

## 2021-05-22 MED ORDER — METHYLERGONOVINE MALEATE 0.2 MG/ML IJ SOLN
INTRAMUSCULAR | Status: AC
  Administered 2021-05-22: 0.2 mg
  Filled 2021-05-22: qty 1

## 2021-05-22 MED ORDER — LIDOCAINE HCL (PF) 1 % IJ SOLN
30.0000 mL | INTRAMUSCULAR | Status: AC | PRN
  Administered 2021-05-22: 30 mL via SUBCUTANEOUS
  Filled 2021-05-22: qty 30

## 2021-05-22 MED ORDER — FLEET ENEMA 7-19 GM/118ML RE ENEM: 1.0000 | ENEMA | RECTAL | Status: DC | PRN

## 2021-05-22 MED ORDER — OXYTOCIN-SODIUM CHLORIDE 30-0.9 UT/500ML-% IV SOLN
1.0000 m[IU]/min | INTRAVENOUS | Status: DC
  Administered 2021-05-22: 2 m[IU]/min via INTRAVENOUS

## 2021-05-22 MED ORDER — OXYTOCIN-SODIUM CHLORIDE 30-0.9 UT/500ML-% IV SOLN
2.5000 [IU]/h | INTRAVENOUS | Status: DC
  Filled 2021-05-22: qty 500

## 2021-05-22 NOTE — H&P (Signed)
35 y.o. [redacted]w[redacted]d  G3P2002 comes in for induction for Pregestational DM, moderately good control.  Otherwise has good fetal movement and no bleeding.  Past Medical History:  Diagnosis Date   GDM (gestational diabetes mellitus) 2016   Gestational diabetes    Headache    History of gestational diabetes    History of pregnancy induced hypertension    Hyperlipidemia    IUD migration    Pneumonia    Prediabetes 2020   SUI (stress urinary incontinence, female)    Wears contact lenses     Past Surgical History:  Procedure Laterality Date   CHOLECYSTECTOMY N/A 06/18/2015   Procedure: LAPAROSCOPIC CHOLECYSTECTOMY WITH INTRAOPERATIVE CHOLANGIOGRAM;  Surgeon: Manus Rudd, MD;  Location: [facility] OR;  Service: General;  Laterality: N/A;   HAND SURGERY  age 56   Cyst on Left Thumb-   HYSTEROSCOPY N/A 06/15/2019   Procedure: HYSTEROSCOPY;  Surgeon: Carrington Clamp, MD;  Location: [facility] [place];  Service: Gynecology;  Laterality: N/A;   LAPAROSCOPY N/A 06/15/2019   Procedure: DIAGNOSTIC LAPAROSCOPY, LAPAROSCOPIC REMOVAL OF INTRAUTERINE DEVICE;  Surgeon: Carrington Clamp, MD;  Location: [facility] [place];  Service: Gynecology;  Laterality: N/A;    OB History  Gravida Para Term Preterm AB Living  3 2 2     2   SAB IAB Ectopic Multiple Live Births        0 2    # Outcome Date GA Lbr Len/2nd Weight Sex Delivery Anes PTL Lv  3 Current           2 Term 08/12/18 [redacted]w[redacted]d 12:08 / 00:53 3810 g F Vag-Spont EPI  LIV  1 Term 01/18/15 [redacted]w[redacted]d 14:20 / 01:30 2860 g M Vag-Spont EPI  LIV    Social History   Socioeconomic History   Marital status: Married    Spouse name: Not on file   Number of children: 0   Years of education: 14   Highest education level: Not on file  Occupational History   Occupation: CMA    Employer: OTHER    Comment: [employer]   Tobacco Use   Smoking status: Never   Smokeless tobacco: Never  Vaping Use   Vaping Use: Never used  Substance and Sexual Activity    Alcohol use: No   Drug use: Never   Sexual activity: Not on file  Other Topics Concern   Not on file  Social History Narrative   Marital Status: Single   Children:  None    Pets: None    Living Situation: Lives with  parents   Country of Origin:  [redacted]w[redacted]d    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    Prenatal Transfer Tool  Maternal Diabetes: Yes:  Diabetes Type:  Insulin/Medication controlled; pregestational Genetic Screening:  Normal Maternal Ultrasounds/Referrals: Normal Fetal Ultrasounds or other Referrals:  Fetal echo Maternal Substance Abuse:  No Significant Maternal Medications:  Meds include: Other:  Significant Maternal Lab Results: insulinGroup B Strep negative  Other PNC: uncomplicated.    Vitals:   05/22/21 1100 05/22/21 1104  BP:  116/68  Pulse:  99  Resp:  17  Temp:  98 F (36.7 C)  TempSrc:  Oral  Weight: 100.7 kg   Height: 5\' 6"  (1.676 m)     Lungs/Cor:  NAD Abdomen:  soft, gravid Ex:  no cords, erythema SVE:  2/50/-2 FHTs:  130s, good STV, NST R; Cat 1 tracing. Toco:  q occ   A/P   Term with pregestational dm, on insulin and AMA.  For induction today.  GBS neg  

## 2021-05-22 NOTE — Anesthesia Preprocedure Evaluation (Signed)
Anesthesia Evaluation  Patient identified by MRN, date of birth, ID band Patient awake    Reviewed: Allergy & Precautions, NPO status , Patient's Chart, lab work & pertinent test results  Airway Mallampati: II  TM Distance: >3 FB Neck ROM: Full    Dental no notable dental hx. (+) Teeth Intact   Pulmonary    Pulmonary exam normal breath sounds clear to auscultation       Cardiovascular Exercise Tolerance: Good Normal cardiovascular exam Rhythm:Regular Rate:Normal     Neuro/Psych  Headaches,    GI/Hepatic negative GI ROS, Neg liver ROS,   Endo/Other  diabetes  Renal/GU negative Renal ROS     Musculoskeletal   Abdominal (+) + obese (BMI 35.85),   Peds  Hematology  (+) anemia , Lab Results      Component                Value               Date                      WBC                      12.9 (H)            05/22/2021                HGB                      9.7 (L)             05/22/2021                HCT                      30.7 (L)            05/22/2021                MCV                      75.4 (L)            05/22/2021                PLT                      277                 05/22/2021              Anesthesia Other Findings   Reproductive/Obstetrics (+) Pregnancy                             Anesthesia Physical Anesthesia Plan  ASA: 3  Anesthesia Plan: Epidural   Post-op Pain Management:    Induction:   PONV Risk Score and Plan:   Airway Management Planned:   Additional Equipment:   Intra-op Plan:   Post-operative Plan:   Informed Consent: I have reviewed the patients History and Physical, chart, labs and discussed the procedure including the risks, benefits and alternatives for the proposed anesthesia with the patient or authorized representative who has indicated his/her understanding and acceptance.       Plan Discussed with:   Anesthesia Plan Comments:  (38.6wk G3P2 w GDM)        Anesthesia Quick  Evaluation

## 2021-05-22 NOTE — Anesthesia Procedure Notes (Signed)
Epidural Patient location during procedure: OB Start time: 05/22/2021 8:08 PM End time: 05/22/2021 8:25 PM  Staffing Anesthesiologist: Trevor Iha, MD Performed: anesthesiologist   Preanesthetic Checklist Completed: patient identified, IV checked, site marked, risks and benefits discussed, surgical consent, monitors and equipment checked, pre-op evaluation and timeout performed  Epidural Patient position: sitting Prep: DuraPrep and site prepped and draped Patient monitoring: continuous pulse ox and blood pressure Approach: midline Location: L2-L3 Injection technique: LOR air  Needle:  Needle type: Tuohy  Needle gauge: 18 G Needle length: 9 cm and 9 Needle insertion depth: 7 cm Catheter type: closed end flexible Catheter size: 20 Guage Catheter at skin depth: 12 cm Test dose: negative  Assessment Events: blood not aspirated, injection not painful, no injection resistance, no paresthesia and negative IV test  Additional Notes Patient identified. Risks/Benefits/Options discussed with patient including but not limited to bleeding, infection, nerve damage, paralysis, failed block, incomplete pain control, headache, blood pressure changes, nausea, vomiting, reactions to medication both or allergic, itching and postpartum back pain. Confirmed with bedside nurse the patient's most recent platelet count. Confirmed with patient that they are not currently taking any anticoagulation, have any bleeding history or any family history of bleeding disorders. Patient expressed understanding and wished to proceed. All questions were answered. Sterile technique was used throughout the entire procedure. Please see nursing notes for vital signs. Test dose was given through epidural needle and negative prior to continuing to dose epidural or start infusion. Warning signs of high block given to the patient including shortness of breath, tingling/numbness in hands, complete motor block, or any  concerning symptoms with instructions to call for help. Patient was given instructions on fall risk and not to get out of bed. All questions and concerns addressed with instructions to call with any issues. 2 Attempt (S) . Patient tolerated procedure well.

## 2021-05-23 LAB — CBC
HCT: 29.5 % — ABNORMAL LOW (ref 36.0–46.0)
Hemoglobin: 9.3 g/dL — ABNORMAL LOW (ref 12.0–15.0)
MCH: 24.2 pg — ABNORMAL LOW (ref 26.0–34.0)
MCHC: 31.5 g/dL (ref 30.0–36.0)
MCV: 76.6 fL — ABNORMAL LOW (ref 80.0–100.0)
Platelets: 252 10*3/uL (ref 150–400)
RBC: 3.85 MIL/uL — ABNORMAL LOW (ref 3.87–5.11)
RDW: 15.7 % — ABNORMAL HIGH (ref 11.5–15.5)
WBC: 17.1 10*3/uL — ABNORMAL HIGH (ref 4.0–10.5)
nRBC: 0 % (ref 0.0–0.2)

## 2021-05-23 LAB — RPR: RPR Ser Ql: NONREACTIVE

## 2021-05-23 LAB — GLUCOSE, CAPILLARY
Glucose-Capillary: 120 mg/dL — ABNORMAL HIGH (ref 70–99)
Glucose-Capillary: 192 mg/dL — ABNORMAL HIGH (ref 70–99)
Glucose-Capillary: 108 mg/dL — ABNORMAL HIGH (ref 70–99)
Glucose-Capillary: 102 mg/dL — ABNORMAL HIGH (ref 70–99)

## 2021-05-23 MED ORDER — ACETAMINOPHEN 325 MG PO TABS
650.0000 mg | ORAL_TABLET | ORAL | Status: DC | PRN
  Administered 2021-05-23: 650 mg via ORAL
  Filled 2021-05-23: qty 2

## 2021-05-23 MED ORDER — SODIUM CHLORIDE 0.9% FLUSH
3.0000 mL | Freq: Two times a day (BID) | INTRAVENOUS | Status: DC
  Administered 2021-05-23 (×2): 3 mL via INTRAVENOUS

## 2021-05-23 MED ORDER — IBUPROFEN 800 MG PO TABS
800.0000 mg | ORAL_TABLET | Freq: Three times a day (TID) | ORAL | Status: DC
  Administered 2021-05-23 – 2021-05-24 (×4): 800 mg via ORAL
  Filled 2021-05-23 (×4): qty 1

## 2021-05-23 MED ORDER — ZOLPIDEM TARTRATE 5 MG PO TABS: 5.0000 mg | ORAL_TABLET | Freq: Every evening | ORAL | Status: DC | PRN

## 2021-05-23 MED ORDER — BENZOCAINE-MENTHOL 20-0.5 % EX AERO: 1.0000 "application " | INHALATION_SPRAY | CUTANEOUS | Status: DC | PRN

## 2021-05-23 MED ORDER — SODIUM CHLORIDE 0.9% FLUSH: 3.0000 mL | INTRAVENOUS | Status: DC | PRN

## 2021-05-23 MED ORDER — SENNOSIDES-DOCUSATE SODIUM 8.6-50 MG PO TABS
2.0000 | ORAL_TABLET | Freq: Every day | ORAL | Status: DC
  Administered 2021-05-23 – 2021-05-24 (×2): 2 via ORAL
  Filled 2021-05-23 (×2): qty 2

## 2021-05-23 MED ORDER — DIBUCAINE (PERIANAL) 1 % EX OINT: 1.0000 "application " | TOPICAL_OINTMENT | CUTANEOUS | Status: DC | PRN

## 2021-05-23 MED ORDER — SODIUM CHLORIDE 0.9 % IV SOLN: 250.0000 mL | INTRAVENOUS | Status: DC | PRN

## 2021-05-23 MED ORDER — SIMETHICONE 80 MG PO CHEW: 80.0000 mg | CHEWABLE_TABLET | ORAL | Status: DC | PRN

## 2021-05-23 MED ORDER — MAGNESIUM HYDROXIDE 400 MG/5ML PO SUSP: 30.0000 mL | ORAL | Status: DC | PRN

## 2021-05-23 MED ORDER — ONDANSETRON HCL 4 MG PO TABS: 4.0000 mg | ORAL_TABLET | ORAL | Status: DC | PRN

## 2021-05-23 MED ORDER — DIPHENHYDRAMINE HCL 25 MG PO CAPS: 25.0000 mg | ORAL_CAPSULE | Freq: Four times a day (QID) | ORAL | Status: DC | PRN

## 2021-05-23 MED ORDER — TETANUS-DIPHTH-ACELL PERTUSSIS 5-2.5-18.5 LF-MCG/0.5 IM SUSY: 0.5000 mL | PREFILLED_SYRINGE | Freq: Once | INTRAMUSCULAR | Status: DC

## 2021-05-23 MED ORDER — OXYCODONE-ACETAMINOPHEN 5-325 MG PO TABS: 2.0000 | ORAL_TABLET | ORAL | Status: DC | PRN

## 2021-05-23 MED ORDER — ONDANSETRON HCL 4 MG/2ML IJ SOLN: 4.0000 mg | INTRAMUSCULAR | Status: DC | PRN

## 2021-05-23 MED ORDER — FERROUS SULFATE 325 (65 FE) MG PO TABS
325.0000 mg | ORAL_TABLET | Freq: Two times a day (BID) | ORAL | Status: DC
  Administered 2021-05-23 – 2021-05-24 (×3): 325 mg via ORAL
  Filled 2021-05-23 (×3): qty 1

## 2021-05-23 MED ORDER — WITCH HAZEL-GLYCERIN EX PADS: 1.0000 "application " | MEDICATED_PAD | CUTANEOUS | Status: DC | PRN

## 2021-05-23 MED ORDER — COCONUT OIL OIL: 1.0000 "application " | TOPICAL_OIL | Status: DC | PRN

## 2021-05-23 MED ORDER — PRENATAL MULTIVITAMIN CH
1.0000 | ORAL_TABLET | Freq: Every day | ORAL | Status: DC
  Administered 2021-05-23 – 2021-05-24 (×2): 1 via ORAL
  Filled 2021-05-23 (×2): qty 1

## 2021-05-23 MED ORDER — MEASLES, MUMPS & RUBELLA VAC IJ SOLR: 0.5000 mL | Freq: Once | INTRAMUSCULAR | Status: DC

## 2021-05-23 MED ORDER — METHYLERGONOVINE MALEATE 0.2 MG PO TABS: 0.2000 mg | ORAL_TABLET | ORAL | Status: DC | PRN

## 2021-05-23 MED ORDER — METHYLERGONOVINE MALEATE 0.2 MG/ML IJ SOLN
0.2000 mg | INTRAMUSCULAR | Status: DC | PRN
Start: 2021-05-23 — End: 2021-05-24

## 2021-05-23 NOTE — Progress Notes (Signed)
Notified Dr. Henderson Cloud that patients blood sugar was 192 . Patient drunk 5-6 oranges juices around 5 am this morning stating that she felt as if her blood sugar was low . Dr. Henderson Cloud ordered a fasting blood sugar for 05/24/2021. Also wanted blood sugar taken before meals.

## 2021-05-23 NOTE — Lactation Note (Signed)
This note was copied from a baby's chart. Lactation Consultation Note  Patient Name: Laura Sexton YIFOY'D Date: 05/23/2021 Reason for consult: Initial assessment;Early term 37-38.6wks;Other (Comment);Maternal endocrine disorder (per mom baby last fed at 6:30am. asleep at present / mom aware to call with feeding cues for latch assessment. per mom baby latching well on the Rt. not on the Lt.Laura Sexton with permission assess tissue / noted areola edema more on the LT. ( shells provided)) Age:77 hours  Maternal Data Has patient been taught Hand Expression?: Yes  Feeding Mother's Current Feeding Choice: Breast Milk  LATCH Score - baby asleep unable to assess latch.                     Lactation Tools Discussed/Used Tools: Shells  Interventions Interventions: Breast feeding basics reviewed;Education;Shells  Discharge    Consult Status Consult Status: Follow-up Date: 05/23/21 Follow-up type: In-patient    Laura Sexton 05/23/2021, 9:27 AM

## 2021-05-23 NOTE — Lactation Note (Signed)
This note was copied from a baby's chart. Lactation Consultation Note Baby was all ready latched when LC came into room. Baby did pop off of breast several times while LC in room. Experienced BF mom has 2 other children. Nipple rounded when came off. Mom has no questions or concerns. Mom is Breast/formula. Praised mom for latching well her own. Will f/u on floor.    Patient Name: Laura Sexton DEYCX'K Date: 05/23/2021 Reason for consult: L&D Initial assessment;Early term 37-38.6wks;Maternal endocrine disorder Age:33 hours  Maternal Data Does the patient have breastfeeding experience prior to this delivery?: Yes  Feeding    LATCH Score Latch: Grasps breast easily, tongue down, lips flanged, rhythmical sucking.  Audible Swallowing: None  Type of Nipple: Everted at rest and after stimulation  Comfort (Breast/Nipple): Soft / non-tender  Hold (Positioning): No assistance needed to correctly position infant at breast.  LATCH Score: 8   Lactation Tools Discussed/Used    Interventions Interventions: Skin to skin;Breast massage;Breast compression;Adjust position;Breast feeding basics reviewed  Discharge    Consult Status Consult Status: Follow-up Date: 05/23/21 Follow-up type: In-patient    Charyl Dancer 05/23/2021, 12:29 AM

## 2021-05-23 NOTE — Progress Notes (Signed)
Patient is eating, ambulating, voiding.  Pain control is good.  Vitals:   05/23/21 0101 05/23/21 0116 05/23/21 0200 05/23/21 0641  BP: 110/66 113/89 127/74 139/77  Pulse: 91 90 80 89  Resp:   20 20  Temp:  97.9 F (36.6 C) 98.2 F (36.8 C) 97.6 F (36.4 C)  TempSrc:   Oral Oral  SpO2:   100% 100%  Weight:      Height:        Fundus firm Perineum without swelling.  Lab Results  Component Value Date   WBC 17.1 (H) 05/23/2021   HGB 9.3 (L) 05/23/2021   HCT 29.5 (L) 05/23/2021   MCV 76.6 (L) 05/23/2021   PLT 252 05/23/2021    --/--/A POS (08/12 1106)/RI  A/P Post partum day 1/2.  Routine care.  Expect d/c tomorrow.   Iron for anemia. 2.  Pregestational DM- was on insulin, insulin stopped.  Pt's fasting this am is falsely elevated secondary pt had a lot of orange juice early am.  Will get preprandial sugars today and fasting tomorrow.   3.  Circ tomorrow when baby seen by peds.  Loney Laurence

## 2021-05-23 NOTE — Anesthesia Postprocedure Evaluation (Signed)
Anesthesia Post Note  Patient: Sports administrator  Procedure(s) Performed: AN AD HOC LABOR EPIDURAL     Patient location during evaluation: Mother Baby Anesthesia Type: Epidural Level of consciousness: awake and alert Pain management: pain level controlled Vital Signs Assessment: post-procedure vital signs reviewed and stable Respiratory status: spontaneous breathing, nonlabored ventilation and respiratory function stable Cardiovascular status: stable Postop Assessment: no headache, no backache and epidural receding Anesthetic complications: no   No notable events documented.  Last Vitals:  Vitals:   05/23/21 0641 05/23/21 1019  BP: 139/77 (!) 99/59  Pulse: 89 80  Resp: 20 18  Temp: 36.4 C   SpO2: 100%     Last Pain:  Vitals:   05/23/21 0914  TempSrc:   PainSc: 0-No pain   Pain Goal:                   Rica Records

## 2021-05-24 ENCOUNTER — Encounter (HOSPITAL_COMMUNITY): Payer: Self-pay

## 2021-05-24 LAB — GLUCOSE, CAPILLARY: Glucose-Capillary: 99 mg/dL (ref 70–99)

## 2021-05-24 NOTE — Progress Notes (Addendum)
Patient is eating, ambulating, voiding.  Pain control is good.  Vitals:   05/23/21 1019 05/23/21 1426 05/23/21 2011 05/24/21 0637  BP: (!) 99/59 (!) 95/54 109/60 110/75  Pulse: 80 84 88 85  Resp: 18 18 18 18   Temp: (!) 97.5 F (36.4 C) 98.1 F (36.7 C) 97.7 F (36.5 C) 97.7 F (36.5 C)  TempSrc: Oral Oral Oral Oral  SpO2: 100% 99% 100% 100%  Weight:      Height:        Fundus firm Perineum without swelling.  Lab Results  Component Value Date   WBC 17.1 (H) 05/23/2021   HGB 9.3 (L) 05/23/2021   HCT 29.5 (L) 05/23/2021   MCV 76.6 (L) 05/23/2021   PLT 252 05/23/2021    --/--/A POS (08/12 1106)/RI  A/P Post partum day 2.  Routine care.  Expect d/c today.  Iron for anemia.   Parents desires circumsision.  All risks, benefits and alternatives discussed with the mother.   Sugars are in range for pp.  Pt to check intermittently at home.  06-06-1981

## 2021-05-24 NOTE — Discharge Summary (Signed)
Postpartum Discharge Summary  Date of Service updated      Patient Name: Laura Sexton DOB: 12-24-1985 MRN: [id number]  Date of admission: 05/22/2021 Delivery date:05/22/2021  Delivering provider: Bobbye Charleston  Date of discharge: 05/24/2021  Admitting diagnosis: AMA (advanced maternal age) multigravida 77+, third trimester [O09.523] Intrauterine pregnancy: [redacted]w[redacted]d    Secondary diagnosis:  Active Problems:   AMA (advanced maternal age) multigravida 330+ third trimester  Additional problems: AMA, insulin treated pregestational diabetes    Discharge diagnosis: Term Pregnancy Delivered and Type 2 DM                                              Post partum procedures: none Augmentation: AROM, Pitocin, and Cytotec Complications: None  Hospital course: Induction of Labor With Vaginal Delivery   35y.o. yo G3P2002 at 339w1das admitted to the hospital 05/22/2021 for induction of labor.  Indication for induction: TYPE 2 DM and AMA.  Patient had an uncomplicated labor course as follows: Membrane Rupture Time/Date: 4:22 PM ,05/22/2021   Delivery Method:Vaginal, Spontaneous  Episiotomy:   Lacerations:  2nd degree  Details of delivery can be found in separate delivery note.  Patient had a routine postpartum course. Patient is discharged home 05/24/21.  Newborn Data: Birth date:05/22/2021  Birth time:11:33 PM  Gender:Female  Living status:Living  Apgars:8 ,9  Weight:3374 g   Magnesium Sulfate received: No BMZ received: No Rhophylac:No MMR:No T-DaP:Given prenatally Flu: No Transfusion:No  Physical exam  Vitals:   05/23/21 1019 05/23/21 1426 05/23/21 2011 05/24/21 0637  BP: (!) 99/59 (!) 95/54 109/60 110/75  Pulse: 80 84 88 85  Resp: _0 Temp: (!) 97.5 F (36.4 C) 98.1 F (36.7 C) 97.7 F (36.5 C) 97.7 F (36.5 C)  TempSrc: Oral Oral Oral Oral  SpO2: 100% 99% 100% 100%  Weight:      Height:       Labs: Lab Results  Component Value Date   WBC 17.1 (H)  05/23/2021   HGB 9.3 (L) 05/23/2021   HCT 29.5 (L) 05/23/2021   MCV 76.6 (L) 05/23/2021   PLT 252 05/23/2021   CMP Latest Ref Rng & Units 06/19/2018  Glucose 70 - 99 mg/dL 97  BUN 6 - 20 mg/dL 8  Creatinine 0.44 - 1.00 mg/dL 0.43(L)  Sodium 135 - 145 mmol/L 134(L)  Potassium 3.5 - 5.1 mmol/L 4.2  Chloride 98 - 111 mmol/L 103  CO2 22 - 32 mmol/L 20(L)  Calcium 8.9 - 10.3 mg/dL 9.4  Total Protein 6.5 - 8.1 g/dL 6.5  Total Bilirubin 0.3 - 1.2 mg/dL 0.2(L)  Alkaline Phos 38 - 126 U/L 76  AST 15 - 41 U/L 16  ALT 0 - 44 U/L 13   Edinburgh Score: Edinburgh Postnatal Depression Scale Screening Tool 08/13/2018  I have been able to laugh and see the funny side of things. 0  I have looked forward with enjoyment to things. 0  I have blamed myself unnecessarily when things went wrong. 0  I have been anxious or worried for no good reason. 2  I have felt scared or panicky for no good reason. 1  Things have been getting on top of me. 0  I have been so unhappy that I have had difficulty sleeping. 0  I have felt sad or miserable. 0  I have been  so unhappy that I have been crying. 0  The thought of harming myself has occurred to me. 0  Edinburgh Postnatal Depression Scale Total 3      After visit meds:  Iron OTC once a day.  Allergies as of 05/24/2021   No Known Allergies      Medication List     STOP taking these medications    azithromycin 250 MG tablet Commonly known as: ZITHROMAX   insulin NPH Human 100 UNIT/ML injection Commonly known as: NOVOLIN N       TAKE these medications    acetaminophen 650 MG CR tablet Commonly known as: TYLENOL Take 650 mg by mouth every 8 (eight) hours as needed for pain or fever.               Discharge Care Instructions  (From admission, onward)           Start     Ordered   05/24/21 0000  Discharge wound care:       Comments: Sitz baths and icepacks to perineum.  If stitches, they will dissolve.   05/24/21 0855              Discharge home in stable condition Infant Feeding: Breast Infant Disposition:home with mother Discharge instruction: per After Visit Summary and Postpartum booklet. Activity: Advance as tolerated. Pelvic rest for 6 weeks.  Diet: carb modified diet Anticipated Birth Control: Unsure Postpartum Appointment:4 weeks Additional Postpartum F/U:  2 weeks f/u sugars Future Appointments:No future appointments. Follow up Visit:  Follow-up Information     Bobbye Charleston, MD Follow up in 2 week(s).   Specialty: Obstetrics and Gynecology Why: Pt should check fastings three times a week and before meal sugars also at least 3 times a week and report those at this visit. Contact information: [place] [address] [phone]                     05/24/2021 Daria Pastures, MD

## 2021-05-24 NOTE — Progress Notes (Signed)
CSW met with Laura Sexton to complete consult for [place] of 20. CSW observed Laura Sexton resting in bed, MGM on couch, and infant at circumcision procedure. Laura Sexton gave CSW verbal consent to complete consult while MGM was present. CSW explained role, and reason for consult. Laura Sexton was pleasant, polite, and engaged with CSW. Laura Sexton reported, history of postpartum depression with her last child. Laura Sexton reported, she was able to manage symptoms without any medication.  However, this time, want to start medication prior to discharge. Laura Sexton reported, the past year, and a half, she has been receiving services at [facility]. Laura Sexton reported, she has already scheduled session with therapist prior to discharge. CSW encourage Laura Sexton to implement healthy coping skills when symptoms arises.   CSW provided education regarding the baby blues period vs. perinatal mood disorders, discussed treatment and gave resources for mental health follow up if concerns arise. CSW recommends self- evaluation during the postpartum time period using the New Mom Checklist from Postpartum Progress and encouraged Laura Sexton to contact a medical professional if symptoms are noted at any time.   When CSW asked Laura Sexton of her emotions since delivery. Laura Sexton reported, she feels, "fine right now, but know postpartum is going to arise in a few weeks". Laura Sexton reported, family, and friends are her supports. Laura Sexton denied SI, HI, and DV when CSW assessed for safety.   Laura Sexton reported, there are no barriers to follow up infant's care. Laura Sexton reported, she has all essentials needed to care for infant. Laura Sexton reported, infant has a car seat, and bassinet. Laura Sexton denied any additional barriers.     CSW provided education on sudden infant death syndrome (SIDS).  CSW identifies no further need for intervention or barriers to discharge at this time.   Darcus Austin, MSW, LCSW-A Clinical Social Worker- Weekends [phone]

## 2021-05-24 NOTE — Lactation Note (Signed)
This note was copied from a baby's chart. Lactation Consultation Note  Patient Name: Boy Junko Ohagan HWTUU'E Date: 05/24/2021 Reason for consult: Follow-up assessment;Early term 37-38.6wks;Infant weight loss;Other (Comment) (5 weight loss,) Age:36 hours LC reviewed the doc flow sheets with mom.  Latch scores range - 8-9  Baby post circ / per mom fed prior to going for his circ and has been sleepy since.  Mom denies sore nipples and the latches have been comfortable.  LC reviewed BF D/C teaching - see below.  Mom aware of the [facility] resources after D/C .   Maternal Data    Feeding Mother's Current Feeding Choice: Breast Milk  LATCH Score                    Lactation Tools Discussed/Used    Interventions    Discharge Discharge Education: Engorgement and breast care;Warning signs for feeding baby Pump: Manual;Personal;DEBP WIC Program: Yes  Consult Status Consult Status: Complete Date: 05/24/21    Kathrin Greathouse 05/24/2021, 11:26 AM

## 2021-05-24 NOTE — Progress Notes (Signed)
Dr. Henderson Cloud notified that PP depression scale is 20. She sees a therapist who suggested she go on medication with next baby. Social sevice is seeing her. Dr. Henderson Cloud said for patient to be seen this week in the office. She is also suppose to check blood sugars before she sees the docter.

## 2021-05-24 NOTE — Discharge Instructions (Signed)
Please see Dr. Henderson Cloud about possible postpartum depression. Please bring log of blood sugars

## 2021-05-30 ENCOUNTER — Inpatient Hospital Stay (HOSPITAL_COMMUNITY): Admit: 2021-05-30 | Payer: Self-pay

## 2021-06-05 ENCOUNTER — Telehealth (HOSPITAL_COMMUNITY): Payer: Self-pay | Admitting: *Deleted

## 2021-06-05 NOTE — Telephone Encounter (Signed)
No answer, so left message to return nurse call. EPDS hospital score on 05-24-2021 = 20. Dr. Henderson Cloud notified of score.   Duffy Rhody, RN 06-05-2021 at 11:20am

## 2021-06-05 NOTE — Telephone Encounter (Signed)
Mom returned my call. She reports feeling physically fine, but emotionally sad. EPDS = 20 (Hospital score = 20) Dr Henderson Cloud notifed of hospital score, but will fax latest score as well. Mom reports baby is doing well. Feeding, peeing, and pooping without difficulty. No concerns about baby.  Duffy Rhody, RN 06-05-2021 at 2:30pm

## 2021-11-30 IMAGING — US US MFM OB FOLLOW-UP
1 series · 13 of 28 positions shown · non-contrast
Comparison: none

[Series 1: us mfm ob follow-up · 13 of 53 slices shown]
[im 2/53]
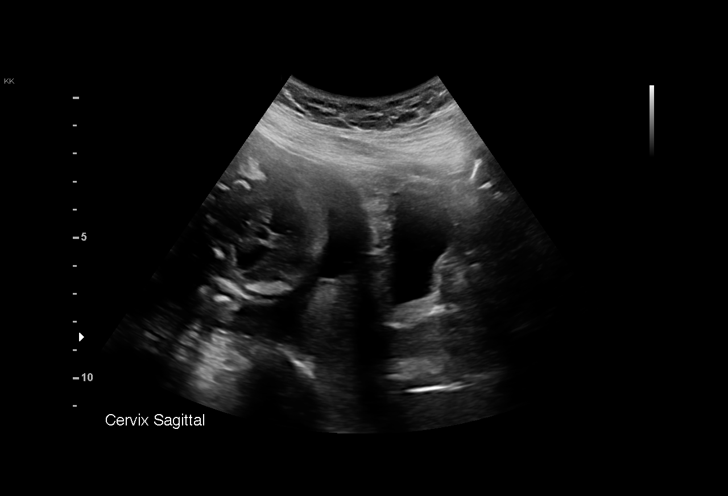
[im 6/53]
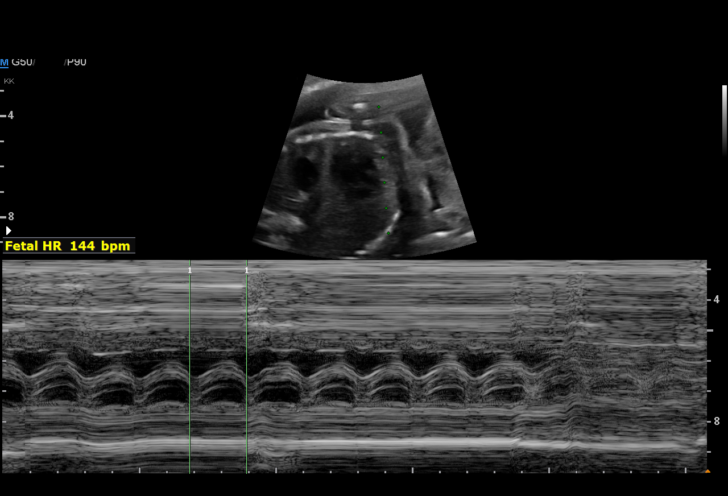
[im 10/53]
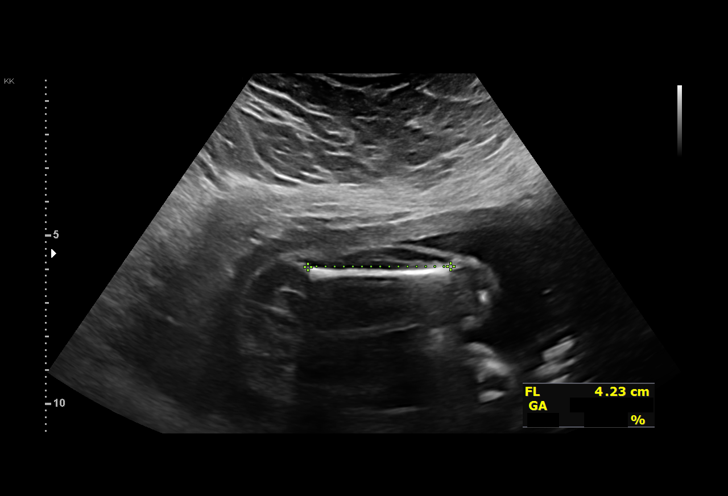
[im 14/53]
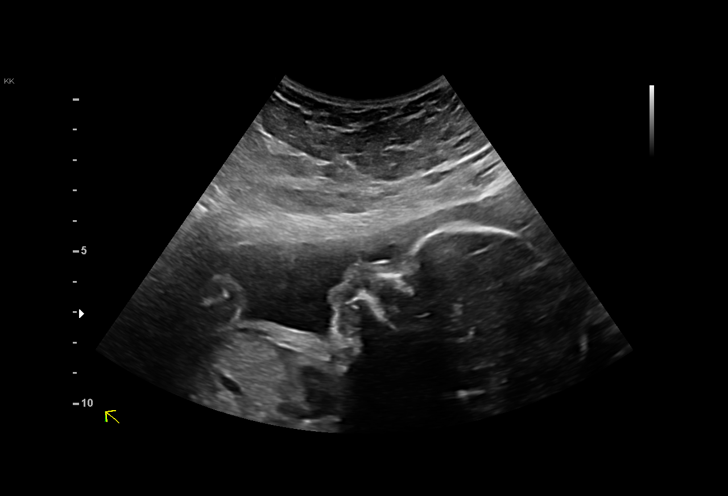
[im 18/53]
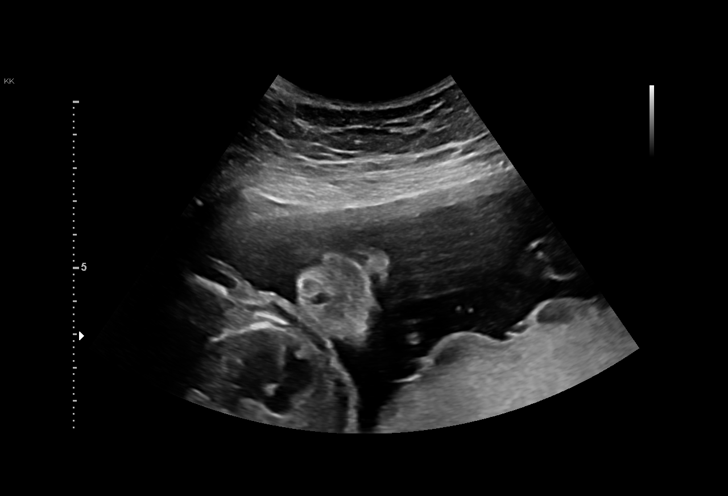
[im 22/53]
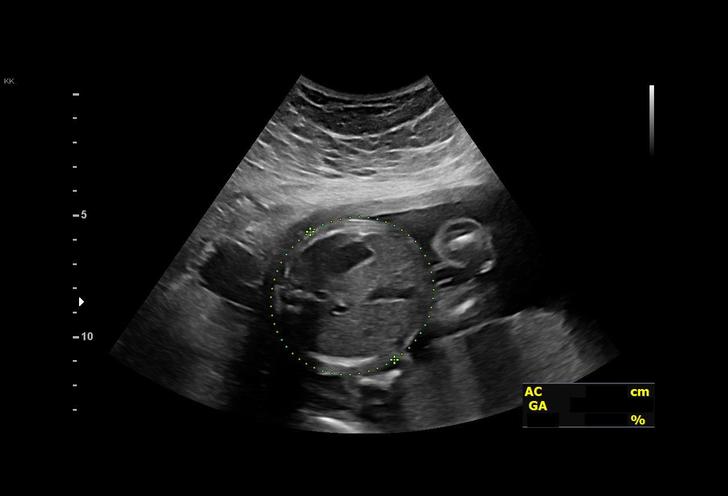
[im 27/53]
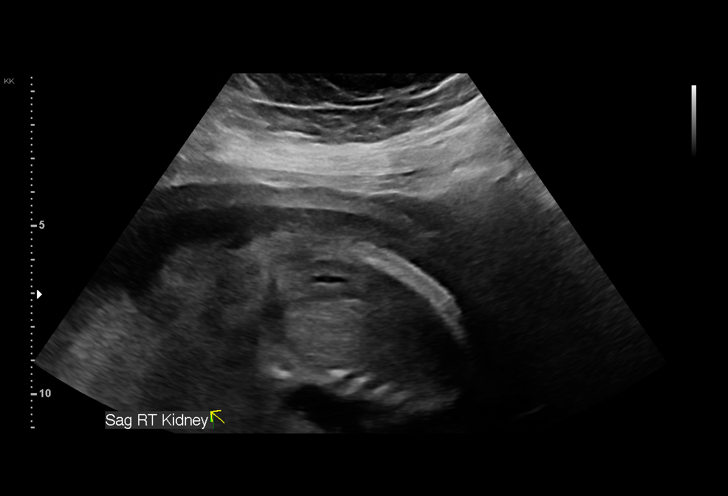
[im 31/53]
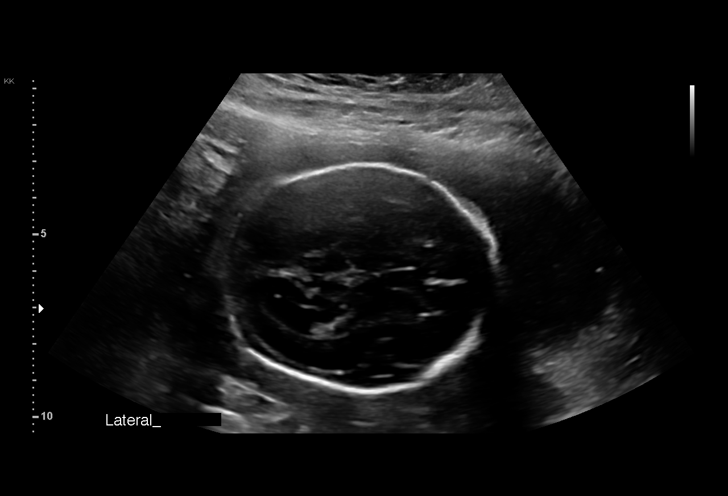
[im 35/53]
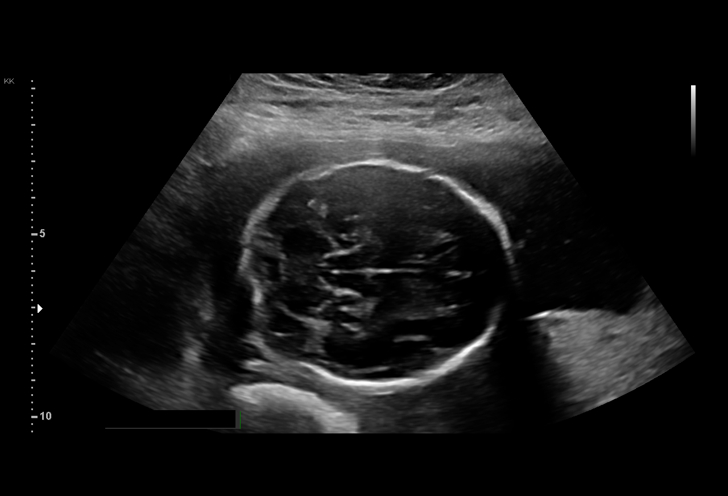
[im 39/53]
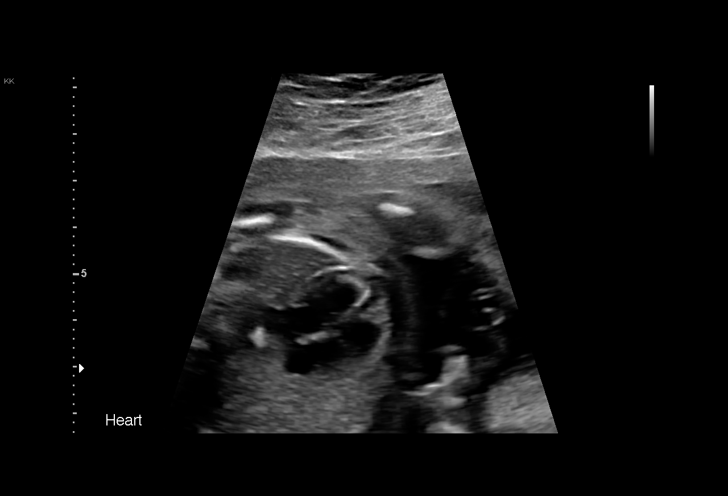
[im 43/53]
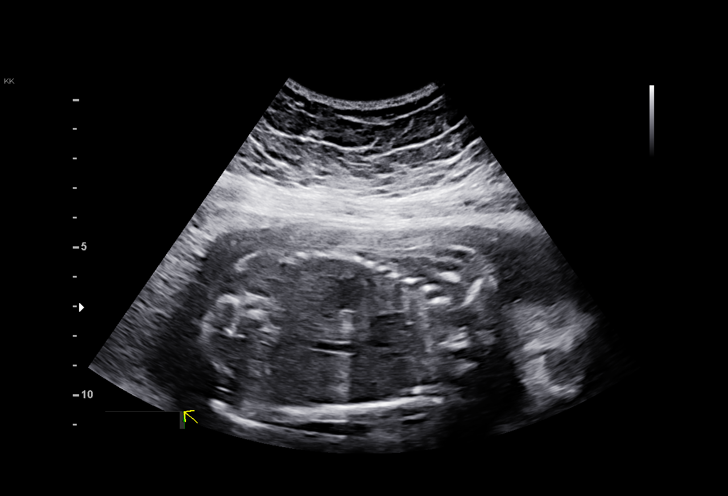
[im 47/53]
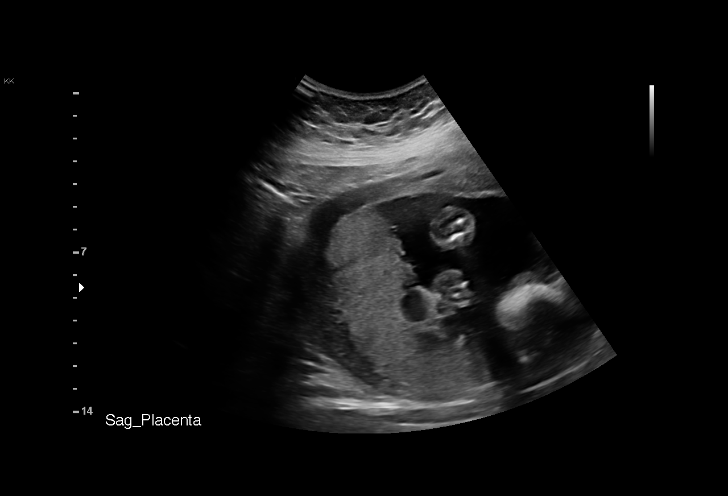
[im 51/53]
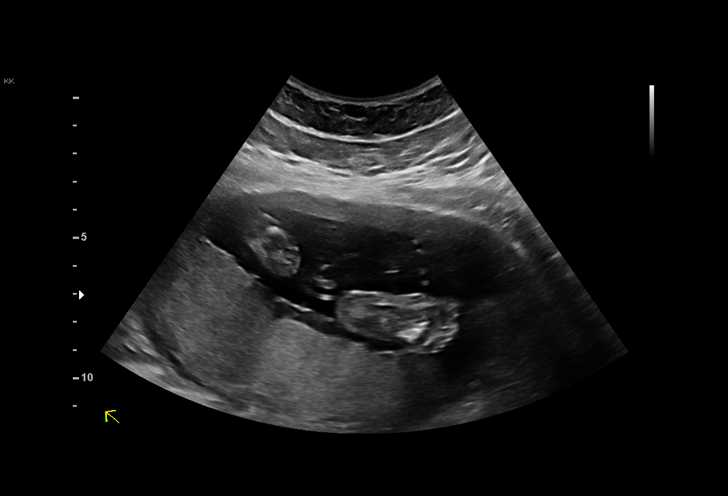

[13 of 28 positions shown; findings below may reference images not displayed]

OB/Gyn and
                                                            Infertility
                   OB/Gyn

                                                      JIN GIL

Indications

 24 weeks gestation of pregnancy
 Advanced maternal age multigravida 35+,
 second trimester(34 yrs)
 Gestational diabetes in pregnancy,
 controlled by oral hypoglycemic
 drugs(Metformin)
 Obesity complicating pregnancy, second
 trimester(Pregravid BMI 34)
 Antenatal follow-up for nonvisualized fetal
 anatomy
 Low Risk NIPS(Negative AFP)
Fetal Evaluation

 Num Of Fetuses:         1
 Fetal Heart Rate(bpm):  144
 Cardiac Activity:       Observed
 Presentation:           Cephalic
 Placenta:               Posterior
 P. Cord Insertion:      Previously Visualized
 Amniotic Fluid
 AFI FV:      Within normal limits

                             Largest Pocket(cm)

Biometry

 BPD:      61.9  mm     G. Age:  25w 1d         68  %    CI:        76.03   %    70 - 86
                                                         FL/HC:      19.2   %    18.7 -
 HC:       225   mm     G. Age:  24w 4d         34  %    HC/AC:      1.11        1.05 -
 AC:      202.9  mm     G. Age:  24w 6d         56  %    FL/BPD:     69.6   %    71 - 87
 FL:       43.1  mm     G. Age:  24w 1d         27  %    FL/AC:      21.2   %    20 - 24
 CER:      27.8  mm     G. Age:  24w 5d         66  %

 Est. FW:     713  gm      1 lb 9 oz     48  %
OB History

 Gravidity:    3         Term:   2        Prem:   0        SAB:   0
 TOP:          0       Ectopic:  0        Living: 2
Gestational Age

 LMP:           24w 3d        Date:  08/23/20                 EDD:   05/30/21
 U/S Today:     24w 5d                                        EDD:   05/28/21
 Best:          24w 3d     Det. By:  LMP  (08/23/20)          EDD:   05/30/21
Anatomy

 Cranium:               Appears normal         Aortic Arch:            Previously seen
 Cavum:                 Previously seen        Ductal Arch:            Previously seen
 Ventricles:            Appears normal         Diaphragm:              Appears normal
 Choroid Plexus:        Previously seen        Stomach:                Appears normal, left
                                                                       sided
 Cerebellum:            Appears normal         Abdomen:                Appears normal
 Posterior Fossa:       Appears normal         Abdominal Wall:         Previously seen
 Nuchal Fold:           Appears normal         Cord Vessels:           Previously seen
 Face:                  Orbits and profile     Kidneys:                Appear normal
                        previously seen
 Lips:                  Appears normal         Bladder:                Appears normal
 Thoracic:              Appears normal         Spine:                  Previously seen
 Heart:                 Appears normal         Upper Extremities:      Previously seen
                        (4CH, axis, and
                        situs)
 RVOT:                  Previously seen        Lower Extremities:      Previously seen
 LVOT:                  Previously seen

 Other:  Fetus appears to be a male. VC, 3VV, 3VTV, Nasal bone,  Heels, and
         5th digit previously visualized.
Cervix Uterus Adnexa

 Cervix
 Not visualized (advanced GA >59wks)
Impression

 Gestational diabetes. Patient takes metformin 1,000 mg at
 night for control. She reports her fasting levels are between
 100 to 110 mg/dL.
 Patient returned for completion of fetal anatomy .
 Fetal growth is appropriate for gestational age .Amniotic fluid
 is normal and good fetal activity is seen .Fetal anatomical
 survey was completed and appears normal.
 I discussed insulin treatment.
Recommendations

 -An appointment was made for her to return in 4 weeks for
 fetal growth assessment.
                 Sachin, Rakibhossain

## 2021-12-22 ENCOUNTER — Other Ambulatory Visit: Payer: Self-pay

## 2021-12-22 ENCOUNTER — Emergency Department (HOSPITAL_BASED_OUTPATIENT_CLINIC_OR_DEPARTMENT_OTHER)
Admission: EM | Admit: 2021-12-22 | Discharge: 2021-12-22 | Disposition: A | Discharge status: Home / Self Care | Payer: Medicaid Other | Attending: Emergency Medicine | Admitting: Emergency Medicine

## 2021-12-22 ENCOUNTER — Emergency Department (HOSPITAL_BASED_OUTPATIENT_CLINIC_OR_DEPARTMENT_OTHER): Payer: Medicaid Other

## 2021-12-22 ENCOUNTER — Encounter (HOSPITAL_BASED_OUTPATIENT_CLINIC_OR_DEPARTMENT_OTHER): Payer: Self-pay | Admitting: Emergency Medicine

## 2021-12-22 DIAGNOSIS — N200 Calculus of kidney: Secondary | ICD-10-CM | POA: Diagnosis not present

## 2021-12-22 DIAGNOSIS — R109 Unspecified abdominal pain: Secondary | ICD-10-CM | POA: Diagnosis present

## 2021-12-22 LAB — URINALYSIS, MICROSCOPIC (REFLEX): WBC, UA: NONE SEEN WBC/hpf (ref 0–5)

## 2021-12-22 LAB — COMPREHENSIVE METABOLIC PANEL
ALT: 25 U/L (ref 0–44)
AST: 19 U/L (ref 15–41)
Albumin: 4.5 g/dL (ref 3.5–5.0)
Alkaline Phosphatase: 51 U/L (ref 38–126)
Anion gap: 11 (ref 5–15)
BUN: 18 mg/dL (ref 6–20)
CO2: 23 mmol/L (ref 22–32)
Calcium: 9.9 mg/dL (ref 8.9–10.3)
Chloride: 105 mmol/L (ref 98–111)
Creatinine, Ser: 0.78 mg/dL (ref 0.44–1.00)
GFR, Estimated: >60 mL/min (ref >60)
Glucose, Bld: 95 mg/dL (ref 70–99)
Potassium: 3.7 mmol/L (ref 3.5–5.1)
Sodium: 139 mmol/L (ref 135–145)
Total Bilirubin: 0.4 mg/dL (ref 0.3–1.2)
Total Protein: 8.8 g/dL — ABNORMAL HIGH (ref 6.5–8.1)

## 2021-12-22 LAB — CBC WITH DIFFERENTIAL/PLATELET
Abs Immature Granulocytes: 0.05 10*3/uL (ref 0.00–0.07)
Basophils Absolute: 0 10*3/uL (ref 0.0–0.1)
Basophils Relative: 0 %
Eosinophils Absolute: 0.1 10*3/uL (ref 0.0–0.5)
Eosinophils Relative: 1 %
HCT: 37.8 % (ref 36.0–46.0)
Hemoglobin: 11.9 g/dL — ABNORMAL LOW (ref 12.0–15.0)
Immature Granulocytes: 0 %
Lymphocytes Relative: 30 %
Lymphs Abs: 3.4 10*3/uL (ref 0.7–4.0)
MCH: 23.2 pg — ABNORMAL LOW (ref 26.0–34.0)
MCHC: 31.5 g/dL (ref 30.0–36.0)
MCV: 73.7 fL — ABNORMAL LOW (ref 80.0–100.0)
Monocytes Absolute: 0.6 10*3/uL (ref 0.1–1.0)
Monocytes Relative: 6 %
Neutro Abs: 7.1 10*3/uL (ref 1.7–7.7)
Neutrophils Relative %: 63 %
Platelets: 190 10*3/uL (ref 150–400)
RBC: 5.13 MIL/uL — ABNORMAL HIGH (ref 3.87–5.11)
RDW: 16 % — ABNORMAL HIGH (ref 11.5–15.5)
Smear Review: ADEQUATE
WBC: 11.3 10*3/uL — ABNORMAL HIGH (ref 4.0–10.5)
nRBC: 0 % (ref 0.0–0.2)

## 2021-12-22 LAB — URINALYSIS, ROUTINE W REFLEX MICROSCOPIC
Bilirubin Urine: NEGATIVE
Glucose, UA: NEGATIVE mg/dL
Ketones, ur: NEGATIVE mg/dL
Nitrite: NEGATIVE
Protein, ur: NEGATIVE mg/dL
Specific Gravity, Urine: 1.015 (ref 1.005–1.030)
pH: 6.5 (ref 5.0–8.0)

## 2021-12-22 LAB — LIPASE, BLOOD: Lipase: 46 U/L (ref 11–51)

## 2021-12-22 LAB — PREGNANCY, URINE: Preg Test, Ur: NEGATIVE

## 2021-12-22 MED ORDER — OXYCODONE-ACETAMINOPHEN 5-325 MG PO TABS: 1.0000 | ORAL_TABLET | Freq: Three times a day (TID) | ORAL | 0 refills | Status: AC | PRN

## 2021-12-22 MED ORDER — ACETAMINOPHEN 500 MG PO TABS
1000.0000 mg | ORAL_TABLET | Freq: Once | ORAL | Status: AC
  Administered 2021-12-22: 1000 mg via ORAL
  Filled 2021-12-22: qty 2

## 2021-12-22 MED ORDER — SODIUM CHLORIDE 0.9 % IV BOLUS
1000.0000 mL | Freq: Once | INTRAVENOUS | Status: AC
  Administered 2021-12-22: 1000 mL via INTRAVENOUS

## 2021-12-22 NOTE — ED Provider Notes (Signed)
?[facility] ?Provider Note ? ? ?CSN: [id number] ?Arrival date & time: 12/22/21  1654 ? ?  ? ?History ? ?Chief Complaint  ?Patient presents with  ? Flank Pain  ? ? ?Laura Sexton is a 36 y.o. female. ? ?Patient with no pertinent past medical history presents today with complaints of left sided flank pain. States that same has been occurring intermittently over the past 2 weeks with acute worsening earlier today. States that pain is sharp in nature and radiates to her left groin. States that the pain waxes and wanes without discernable trigger. Endorses associated nausea without vomiting with some urinary frequency without dysuria or hematuria. States that she had a tele visit with her PCP earlier in the week and was given macrobid for suspected UTI. She has been taking this since Saturday. Patient is currently breastfeeding her 20 month old and has yet to have a menstrual cycle since her pregnancy. She denies any vaginal discharge or concerns for STDs. Denies any fevers or chills. ? ?The history is provided by the patient. No language interpreter was used.  ?Flank Pain ?Pertinent negatives include no abdominal pain.  ? ?  ? ?Home Medications ?Prior to Admission medications   ?Medication Sig Start Date End Date Taking? Authorizing Provider  ?acetaminophen (TYLENOL) 650 MG CR tablet Take 650 mg by mouth every 8 (eight) hours as needed for pain or fever.    [provider]  ?norethindrone (MICRONOR) 0.35 MG tablet Take 1 tablet by mouth at bedtime. ?Patient not taking: Reported on 11/24/2020  02/13/21  [provider]  ?   ? ?Allergies    ?Patient has no known allergies.   ? ?Review of Systems   ?Review of Systems  ?Constitutional:  Negative for chills and fever.  ?Gastrointestinal:  Positive for nausea. Negative for abdominal pain, diarrhea and vomiting.  ?Genitourinary:  Positive for flank pain and frequency. Negative for decreased urine volume, difficulty urinating,  dyspareunia, dysuria, enuresis, genital sores, hematuria, menstrual problem, pelvic pain, urgency, vaginal bleeding, vaginal discharge and vaginal pain.  ?All other systems reviewed and are negative. ? ?Physical Exam ?Updated Vital Signs ?BP (!) 107/58   Pulse 85   Temp 97.8 ?F (36.6 ?C)   Resp 16   Ht 5\' 6"  (1.676 m)   Wt 91.6 kg   SpO2 100%   Breastfeeding Yes   BMI 32.60 kg/m?  ?Physical Exam ?Vitals and nursing note reviewed.  ?Constitutional:   ?   General: She is not in acute distress. ?   Appearance: Normal appearance. She is normal weight. She is not ill-appearing, toxic-appearing or diaphoretic.  ?   Comments: Patient resting comfortably in bed in no acute distress  ?HENT:  ?   Head: Normocephalic and atraumatic.  ?Eyes:  ?   Extraocular Movements: Extraocular movements intact.  ?Cardiovascular:  ?   Rate and Rhythm: Normal rate and regular rhythm.  ?   Heart sounds: Normal heart sounds.  ?Pulmonary:  ?   Effort: Pulmonary effort is normal. No respiratory distress.  ?   Breath sounds: Normal breath sounds.  ?Abdominal:  ?   General: Abdomen is flat.  ?   Palpations: Abdomen is soft.  ?   Tenderness: There is no abdominal tenderness. There is no right CVA tenderness or left CVA tenderness.  ?Musculoskeletal:     ?   General: Normal range of motion.  ?   Cervical back: Normal range of motion.  ?Skin: ?   General: Skin is  warm and dry.  ?Neurological:  ?   General: No focal deficit present.  ?   Mental Status: She is alert.  ?Psychiatric:     ?   Mood and Affect: Mood normal.     ?   Behavior: Behavior normal.  ? ? ?ED Results / Procedures / Treatments   ?Labs ?(all labs ordered are listed, but only abnormal results are displayed) ?Labs Reviewed  ?URINALYSIS, ROUTINE W REFLEX MICROSCOPIC - Abnormal; Notable for the following components:  ?    Result Value  ? Hgb urine dipstick TRACE (*)   ? Leukocytes,Ua TRACE (*)   ? All other components within normal limits  ?URINALYSIS, MICROSCOPIC (REFLEX) -  Abnormal; Notable for the following components:  ? Bacteria, UA RARE (*)   ? All other components within normal limits  ?COMPREHENSIVE METABOLIC PANEL - Abnormal; Notable for the following components:  ? Total Protein 8.8 (*)   ? All other components within normal limits  ?CBC WITH DIFFERENTIAL/PLATELET - Abnormal; Notable for the following components:  ? WBC 11.3 (*)   ? RBC 5.13 (*)   ? Hemoglobin 11.9 (*)   ? MCV 73.7 (*)   ? MCH 23.2 (*)   ? RDW 16.0 (*)   ? All other components within normal limits  ?PREGNANCY, URINE  ?LIPASE, BLOOD  ? ? ?EKG ?None ? ?Radiology ?CT Renal Stone Study ? ?Result Date: 12/22/2021 ?CLINICAL DATA:  Left flank pain, kidney stone suspected. Urinary frequency and urgency. EXAM: CT ABDOMEN AND PELVIS WITHOUT CONTRAST TECHNIQUE: Multidetector CT imaging of the abdomen and pelvis was performed following the standard protocol without IV contrast. RADIATION DOSE REDUCTION: This exam was performed according to the departmental dose-optimization program which includes automated exposure control, adjustment of the mA and/or kV according to patient size and/or use of iterative reconstruction technique. COMPARISON:  None. FINDINGS: Lower chest: Lung bases are clear. Hepatobiliary: No focal liver abnormality is seen. Status post cholecystectomy. No biliary dilatation. Pancreas: Unremarkable. No pancreatic ductal dilatation or surrounding inflammatory changes. Spleen: Normal in size without focal abnormality. Adrenals/Urinary Tract: No adrenal gland nodules. Stone in the left ureteropelvic junction measuring 3 mm diameter. Moderate proximal hydronephrosis. Distal ureter is decompressed. Right kidney, right ureter, and the bladder are unremarkable. Stomach/Bowel: Stomach is within normal limits. Appendix appears normal. No evidence of bowel wall thickening, distention, or inflammatory changes. Vascular/Lymphatic: No significant vascular findings are present. No enlarged abdominal or pelvic lymph  nodes. Reproductive: Uterus and bilateral adnexa are unremarkable. Other: No abdominal wall hernia or abnormality. No abdominopelvic ascites. Musculoskeletal: No acute or significant osseous findings. IMPRESSION: 1. 3 mm stone in the proximal left ureter at the ureteropelvic junction with moderate proximal obstruction. 2. No evidence of bowel obstruction or inflammation. Appendix is normal. Electronically Signed   By: Lucienne Capers M.D.   On: 12/22/2021 19:59   ? ?Procedures ?Procedures  ? ? ?Medications Ordered in ED ?Medications  ?sodium chloride 0.9 % bolus 1,000 mL (1,000 mLs Intravenous New Bag/Given 12/22/21 2016)  ?acetaminophen (TYLENOL) tablet 1,000 mg (1,000 mg Oral Given 12/22/21 1951)  ? ? ?ED Course/ Medical Decision Making/ A&P ?  ?                        ?Medical Decision Making ?Amount and/or Complexity of Data Reviewed ?Labs: ordered. ?Radiology: ordered. ? ?Risk ?OTC drugs. ?Prescription drug management. ? ? ?This patient presents to the ED for concern of flank pain, this involves an extensive  number of treatment options, and is a complaint that carries with it a high risk of complications and morbidity. ? ?Co morbidities that complicate the patient evaluation ? ?none ? ? ?Lab Tests: ? ?I Ordered, and personally interpreted labs.  The pertinent results include:  UA shows trace blood and leukocytes. Upreg negative. No electrolyte abnormalities or changes in kidney function. Leukocytosis and anemia consistent with baseline ? ? ?Imaging Studies ordered: ? ?I ordered imaging studies including CT renal   ?I independently visualized and interpreted imaging which showed  ?1. 3 mm stone in the proximal left ureter at the ureteropelvic junction with moderate proximal obstruction. ?2. No evidence of bowel obstruction or inflammation. Appendix is normal. ?I agree with the radiologist interpretation ? ?Medicines ordered and prescription drug management: ? ?I ordered medication including tylenol  for pain and  fluids for dehydration  ?Reevaluation of the patient after these medicines showed that the patient improved ? ?Pt presents today with left flank pain. Kidney stone present on the left side visualized by CT. There is no evide

## 2021-12-22 NOTE — Discharge Instructions (Signed)
Your CT scan revealed that you have a kidney stone on the left side which is likely the cause of your pain. As we discussed, I have given you a prescription for narcotic to take if you are not all your pain with Tylenol and ibuprofen.  If you decide to take this, you will need to discard your breastmilk while you are taking this medication.  You will also need to not drive or operate heavy machinery.  Otherwise, you should pass this stone in the next few days.  I recommend plenty of oral hydration and elevated time.  I have also given you a referral to urology for to follow-up for continued evaluation and management. ? ?Return if development of any new or worsening symptoms. ?

## 2021-12-22 NOTE — ED Triage Notes (Signed)
Pt arrives pov with steady gait to triage, c/o left flank pain radiating to abdomen, also reports uriary freq and urgency. Pt currently taking macrobid since sat. Endorses nausea, denies fever ?

## 2021-12-30 ENCOUNTER — Other Ambulatory Visit: Payer: Self-pay

## 2021-12-30 ENCOUNTER — Encounter (HOSPITAL_BASED_OUTPATIENT_CLINIC_OR_DEPARTMENT_OTHER): Payer: Self-pay | Admitting: Emergency Medicine

## 2021-12-30 ENCOUNTER — Emergency Department (HOSPITAL_BASED_OUTPATIENT_CLINIC_OR_DEPARTMENT_OTHER): Payer: Medicaid Other

## 2021-12-30 ENCOUNTER — Other Ambulatory Visit (HOSPITAL_BASED_OUTPATIENT_CLINIC_OR_DEPARTMENT_OTHER): Payer: Self-pay

## 2021-12-30 ENCOUNTER — Emergency Department (HOSPITAL_BASED_OUTPATIENT_CLINIC_OR_DEPARTMENT_OTHER)
Admission: EM | Admit: 2021-12-30 | Discharge: 2021-12-30 | Disposition: A | Discharge status: Home / Self Care | Payer: Medicaid Other | Attending: Emergency Medicine | Admitting: Emergency Medicine

## 2021-12-30 DIAGNOSIS — N201 Calculus of ureter: Secondary | ICD-10-CM | POA: Diagnosis not present

## 2021-12-30 DIAGNOSIS — R109 Unspecified abdominal pain: Secondary | ICD-10-CM | POA: Diagnosis present

## 2021-12-30 LAB — PREGNANCY, URINE: Preg Test, Ur: NEGATIVE

## 2021-12-30 LAB — CBC WITH DIFFERENTIAL/PLATELET
Abs Immature Granulocytes: 0.03 10*3/uL (ref 0.00–0.07)
Basophils Absolute: 0 10*3/uL (ref 0.0–0.1)
Basophils Relative: 0 %
Eosinophils Absolute: 0.1 10*3/uL (ref 0.0–0.5)
Eosinophils Relative: 1 %
HCT: 32.6 % — ABNORMAL LOW (ref 36.0–46.0)
Hemoglobin: 10.5 g/dL — ABNORMAL LOW (ref 12.0–15.0)
Immature Granulocytes: 0 %
Lymphocytes Relative: 25 %
Lymphs Abs: 2.6 10*3/uL (ref 0.7–4.0)
MCH: 23.6 pg — ABNORMAL LOW (ref 26.0–34.0)
MCHC: 32.2 g/dL (ref 30.0–36.0)
MCV: 73.3 fL — ABNORMAL LOW (ref 80.0–100.0)
Monocytes Absolute: 0.3 10*3/uL (ref 0.1–1.0)
Monocytes Relative: 3 %
Neutro Abs: 7.1 10*3/uL (ref 1.7–7.7)
Neutrophils Relative %: 71 %
Platelets: 255 10*3/uL (ref 150–400)
RBC: 4.45 MIL/uL (ref 3.87–5.11)
RDW: 15.9 % — ABNORMAL HIGH (ref 11.5–15.5)
WBC: 10.2 10*3/uL (ref 4.0–10.5)
nRBC: 0 % (ref 0.0–0.2)

## 2021-12-30 LAB — URINALYSIS, ROUTINE W REFLEX MICROSCOPIC
Bilirubin Urine: NEGATIVE
Glucose, UA: NEGATIVE mg/dL
Ketones, ur: NEGATIVE mg/dL
Nitrite: NEGATIVE
Protein, ur: NEGATIVE mg/dL
Specific Gravity, Urine: 1.02 (ref 1.005–1.030)
pH: 5.5 (ref 5.0–8.0)

## 2021-12-30 LAB — BASIC METABOLIC PANEL
Anion gap: 8 (ref 5–15)
BUN: 18 mg/dL (ref 6–20)
CO2: 19 mmol/L — ABNORMAL LOW (ref 22–32)
Calcium: 9.3 mg/dL (ref 8.9–10.3)
Chloride: 109 mmol/L (ref 98–111)
Creatinine, Ser: 0.73 mg/dL (ref 0.44–1.00)
GFR, Estimated: >60 mL/min (ref >60)
Glucose, Bld: 132 mg/dL — ABNORMAL HIGH (ref 70–99)
Potassium: 4 mmol/L (ref 3.5–5.1)
Sodium: 136 mmol/L (ref 135–145)

## 2021-12-30 LAB — URINALYSIS, MICROSCOPIC (REFLEX)

## 2021-12-30 MED ORDER — HYDROCODONE-ACETAMINOPHEN 5-325 MG PO TABS
1.0000 | ORAL_TABLET | Freq: Four times a day (QID) | ORAL | 0 refills | Status: DC | PRN
  Filled 2021-12-30: qty 14, 4d supply, fill #0

## 2021-12-30 MED ORDER — ONDANSETRON HCL 4 MG/2ML IJ SOLN
4.0000 mg | Freq: Once | INTRAMUSCULAR | Status: AC
  Administered 2021-12-30: 4 mg via INTRAVENOUS
  Filled 2021-12-30: qty 2

## 2021-12-30 MED ORDER — HYDROMORPHONE HCL 1 MG/ML IJ SOLN
1.0000 mg | Freq: Once | INTRAMUSCULAR | Status: AC
  Administered 2021-12-30: 1 mg via INTRAVENOUS
  Filled 2021-12-30: qty 1

## 2021-12-30 MED ORDER — ONDANSETRON 4 MG PO TBDP
4.0000 mg | ORAL_TABLET | Freq: Three times a day (TID) | ORAL | 1 refills | Status: DC | PRN
  Filled 2021-12-30: qty 12, 4d supply, fill #0

## 2021-12-30 NOTE — ED Notes (Signed)
ED Provider at bedside. 

## 2021-12-30 NOTE — ED Notes (Addendum)
Patient back from CT.

## 2021-12-30 NOTE — ED Triage Notes (Signed)
Pt arrives pov, steady gait with c/o of continued left flank pain that increased last night. Endorses nausea, urgency and freq. Endorses 1 gram tylenol at 0500 today ?

## 2021-12-30 NOTE — ED Notes (Signed)
Spoke with shay in lab to add on urine culture 

## 2021-12-30 NOTE — ED Provider Notes (Addendum)
?[facility]PARTMENT ?Provider Note ? ? ?CSN: [id number] ?Arrival date & time: 12/30/21  4098 ? ?  ? ?History ? ?Chief Complaint  ?Patient presents with  ? Flank Pain  ? ? ?Bonnie Overdorf is a 36 y.o. female. ? ?Patient seen here for left-sided renal ureter calculus on March 14.  3 mm stone was in the proximal ureter at that time.  Patient states pain never really completely went away but it did subside.  But at 2 this morning it got significantly worse on the left side.  Associated with nausea but no vomiting.  Patient denies any fevers. ? ?Past medical history significant for hyperlipidemia history of gestational diabetes gallbladder removal. ? ? ?  ? ?Home Medications ?Prior to Admission medications   ?Medication Sig Start Date End Date Taking? Authorizing Provider  ?norethindrone (MICRONOR) 0.35 MG tablet Take 1 tablet by mouth at bedtime. ?Patient not taking: Reported on 11/24/2020  02/13/21  [provider]  ?   ? ?Allergies    ?Patient has no known allergies.   ? ?Review of Systems   ?Review of Systems  ?Constitutional:  Negative for chills and fever.  ?HENT:  Negative for ear pain and sore throat.   ?Eyes:  Negative for pain and visual disturbance.  ?Respiratory:  Negative for cough and shortness of breath.   ?Cardiovascular:  Negative for chest pain and palpitations.  ?Gastrointestinal:  Positive for nausea. Negative for abdominal pain and vomiting.  ?Genitourinary:  Positive for flank pain. Negative for dysuria and hematuria.  ?Musculoskeletal:  Negative for arthralgias and back pain.  ?Skin:  Negative for color change and rash.  ?Neurological:  Negative for seizures and syncope.  ?All other systems reviewed and are negative. ? ?Physical Exam ?Updated Vital Signs ?BP 111/80   Pulse 66   Temp 97.9 ?F (36.6 ?C) (Oral)   Resp 18   Ht 1.676 m (5\' 6" )   Wt 90.7 kg   SpO2 98%   Breastfeeding Yes   BMI 32.28 kg/m?  ?Physical Exam ?Vitals and nursing note reviewed.   ?Constitutional:   ?   General: She is not in acute distress. ?   Appearance: She is well-developed.  ?HENT:  ?   Head: Normocephalic and atraumatic.  ?Eyes:  ?   Extraocular Movements: Extraocular movements intact.  ?   Conjunctiva/sclera: Conjunctivae normal.  ?   Pupils: Pupils are equal, round, and reactive to light.  ?Cardiovascular:  ?   Rate and Rhythm: Normal rate and regular rhythm.  ?   Heart sounds: No murmur heard. ?Pulmonary:  ?   Effort: Pulmonary effort is normal. No respiratory distress.  ?   Breath sounds: Normal breath sounds.  ?Abdominal:  ?   Palpations: Abdomen is soft.  ?   Tenderness: There is no abdominal tenderness.  ?Musculoskeletal:     ?   General: No swelling.  ?   Cervical back: Normal range of motion and neck supple.  ?Skin: ?   General: Skin is warm and dry.  ?   Capillary Refill: Capillary refill takes less than 2 seconds.  ?Neurological:  ?   General: No focal deficit present.  ?   Mental Status: She is alert and oriented to person, place, and time.  ?Psychiatric:     ?   Mood and Affect: Mood normal.  ? ? ?ED Results / Procedures / Treatments   ?Labs ?(all labs ordered are listed, but only abnormal results are displayed) ?Labs Reviewed  ?URINALYSIS,  ROUTINE W REFLEX MICROSCOPIC - Abnormal; Notable for the following components:  ?    Result Value  ? Hgb urine dipstick MODERATE (*)   ? Leukocytes,Ua TRACE (*)   ? All other components within normal limits  ?CBC WITH DIFFERENTIAL/PLATELET - Abnormal; Notable for the following components:  ? Hemoglobin 10.5 (*)   ? HCT 32.6 (*)   ? MCV 73.3 (*)   ? MCH 23.6 (*)   ? RDW 15.9 (*)   ? All other components within normal limits  ?BASIC METABOLIC PANEL - Abnormal; Notable for the following components:  ? CO2 19 (*)   ? Glucose, Bld 132 (*)   ? All other components within normal limits  ?URINALYSIS, MICROSCOPIC (REFLEX) - Abnormal; Notable for the following components:  ? Bacteria, UA RARE (*)   ? All other components within normal limits   ?URINE CULTURE  ?PREGNANCY, URINE  ? ? ?EKG ?None ? ?Radiology ?CT RENAL STONE STUDY ? ?Result Date: 12/30/2021 ?CLINICAL DATA:  Flank pain.  Kidney stone suspected. EXAM: CT ABDOMEN AND PELVIS WITHOUT CONTRAST TECHNIQUE: Multidetector CT imaging of the abdomen and pelvis was performed following the standard protocol without IV contrast. RADIATION DOSE REDUCTION: This exam was performed according to the departmental dose-optimization program which includes automated exposure control, adjustment of the mA and/or kV according to patient size and/or use of iterative reconstruction technique. COMPARISON:  12/22/2021 FINDINGS: Lower chest: Normal Hepatobiliary: Previous cholecystectomy. Liver parenchyma is normal. Pancreas: Normal Spleen: Normal Adrenals/Urinary Tract: Adrenal glands are normal. Right kidney is normal. No cyst, stone, mass or hydronephrosis. Left kidney continues to show swelling and hydronephrosis. 1 mm nonobstructing stone in the lower pole. There has been only slight downward progression the proximal ureteral calculus on the left. The stone measures 7-8 mm in length with a transverse dimension of 3 mm. No stone in the bladder. Stomach/Bowel: Stomach and small intestine are normal. Moderate amount of fecal matter throughout the colon. Vascular/Lymphatic: Aorta and IVC are normal.  No adenopathy. Reproductive: Normal Other: No free fluid or air. Musculoskeletal: Normal IMPRESSION: Previously seen stone in the proximal left ureter has only advanced 1 or 2 cm since the prior CT of 8 days ago. This stone is 7-8 mm in length with a transverse diameter of 3 mm. Degree of hydronephrosis on the left is mild-to-moderate. Electronically Signed   By: Paulina Fusi M.D.   On: 12/30/2021 08:38   ? ?Procedures ?Procedures  ? ? ?Medications Ordered in ED ?Medications  ?ondansetron (ZOFRAN) injection 4 mg (4 mg Intravenous Given 12/30/21 0759)  ?HYDROmorphone (DILAUDID) injection 1 mg (1 mg Intravenous Given 12/30/21  0800)  ? ? ?ED Course/ Medical Decision Making/ A&P ?  ?                        ?Medical Decision Making ?Amount and/or Complexity of Data Reviewed ?Labs: ordered. ?Radiology: ordered. ? ?Risk ?Prescription drug management. ? ? ?We will get CT scan renal.  Most likely persistent stone on the left side.  Possibly could be a new stone.  Also will get basic labs and urinalysis patient's pregnancy test was negative on the 14th but will repeat today since we get urinalysis.  We will start symptomatic treatment with pain medicine and antinausea medicine. ? ?Patient's urinalysis does have some increase in RBCs and white blood cells 21-50 bacteria is rare but does not seem to be consistent with urinary tract infection.  Urine pregnancy test negative again the  CT renal scan previously seen stone in proximal left ureter is only advanced 1 or 2 cm prior to CT 8 days ago.  The stone is 78 mm in length with a transverse diameter 3 mm.  Degree of hydronephrosis on the left is mild to moderate. ? ?Discussed with urology on-call about this.  Since its been about a week.  Do not feel patient has evidence of infection does not think she needs to be treated with antibiotics at this time.  Renal function is normal which is very reassuring her creatinine is 0.73.  No leukocytosis hemoglobin down a little bit at 10.5. ? ?Patient was given a prescription for hydrocodone the last time.  She never got it filled because she is breast-feeding.  But feels that she may need something stronger this time. ? ?Discussed with Dr. Laverle PatterBorden from urology.  They do agree that she definitely needs to follow-up and make an outpatient appointment.  The odds of her passing the stone are slim at this point.  We will treat her symptomatically. ? ?Final Clinical Impression(s) / ED Diagnoses ?Final diagnoses:  ?Left flank pain  ?Left ureteral stone  ? ? ?Rx / DC Orders ?ED Discharge Orders   ? ? None  ? ?  ? ? ?  ?Vanetta MuldersZackowski, Macayla Ekdahl, MD ?12/30/21 [id number] ? ?   ?Vanetta MuldersZackowski, Pinchos Topel, MD ?12/30/21 [phone] ? ?

## 2021-12-30 NOTE — Discharge Instructions (Addendum)
Discussed with urology.  The odds of you passing the stone are minimal at this point in time.  So call and set up an appointment to be seen as an outpatient with [facility].  Information provided above. ? ?Take the antinausea medicine as directed take the pain medicine as needed.  You can continue to take Motrin if it is helpful your renal function was normal. ?

## 2021-12-30 NOTE — ED Notes (Signed)
Pharmacy and medications updated with patient 

## 2022-01-01 LAB — URINE CULTURE: Culture: 20000 — AB

## 2022-01-02 ENCOUNTER — Telehealth (HOSPITAL_BASED_OUTPATIENT_CLINIC_OR_DEPARTMENT_OTHER): Payer: Self-pay | Admitting: *Deleted

## 2022-01-02 NOTE — Telephone Encounter (Signed)
Post ED Visit - Positive Culture Follow-up ? ?Culture report reviewed by antimicrobial stewardship pharmacist: ?[facility] ?[x]  , Pharm.D. ?[]  Diona Foley, .D., BCPS AQ-ID ?[]  Celedonio Miyamoto, Pharm.D., BCPS ?[]  [address], Pharm.D., BCPS ?[]  [place], Garvin Fila.D., BCPS, AAHIVP ?[]  , Pharm.D., BCPS, AAHIVP ?[]  Georgina Pillion, PharmD, BCPS ?[]  , PharmD, BCPS ?[]  [place], PharmD, BCPS ?[]  [address], PharmD ?[]  , PharmD, BCPS ?[]  Estella Husk, PharmD ? ? [facility] ?[]  Lysle Pearl, PharmD ?[]  , PharmD ?[]  Phillips Climes, PharmD ?[]  , Rph ?[]  Agapito Games) , PharmD ?[]  Verlan Friends, PharmD ?[]  , PharmD ?[]  Mervyn Gay, PharmD ?[]  , PharmD ?[]  Vinnie Level, PharmD ?[]  Gerri Spore, PharmD ?[]  , PharmD ?[]  Len Childs, PharmD ? ? ?Positive urine culture ?Treated with Keflex at follow up with urology, organism sensitive to the same and no further patient follow-up is required at this time. ? ? ?01/02/2022, 2:32 PM ?  ?

## 2022-02-11 ENCOUNTER — Ambulatory Visit: Payer: Medicaid Other | Admitting: Nurse Practitioner

## 2022-02-11 ENCOUNTER — Encounter: Payer: Self-pay | Admitting: Nurse Practitioner

## 2022-02-11 VITALS — BP 122/78 | HR 72 | Temp 98.0°F | Ht 66.0 in | Wt 196.0 lb

## 2022-02-11 DIAGNOSIS — Z7689 Persons encountering health services in other specified circumstances: Secondary | ICD-10-CM

## 2022-02-11 NOTE — Progress Notes (Cosign Needed)
? ?  Acute Office Visit ? ?Subjective:  ?Patient is a 36 y/o female who presents today for establishment of care. Reports she wants to consistently be seen by the same provider. She will schedule an appoint for a physical in 3 months.    ?  ?Patient ID: Laura Sexton, female    DOB: Feb 19, 1986, 36 y.o.   MRN: [id number] ? ?Chief Complaint  ?Patient presents with  ? Establish Care  ? ? ?HPI ? ? ?Review of Systems  ?Constitutional: Negative.   ?Respiratory: Negative.    ?Cardiovascular: Negative.   ?Gastrointestinal: Negative.   ? ? ?   ?Objective:  ?  ?BP 122/78   Pulse 72   Temp 98 ?F (36.7 ?C)   Ht 5\' 6"  (1.676 m)   Wt 196 lb (88.9 kg)   SpO2 98%   BMI 31.64 kg/m?  ? ? ?Patients medical, surgical and social history reviewed. She appears well and without distress. Denis any pain or discomfort at this time.  ? ?Physical Exam ?Constitutional:   ?   General: She is not in acute distress. ?   Appearance: Normal appearance. She is obese.  ?Cardiovascular:  ?   Rate and Rhythm: Normal rate and regular rhythm.  ?Pulmonary:  ?   Effort: Pulmonary effort is normal.  ?   Breath sounds: Normal breath sounds.  ?Abdominal:  ?   General: Bowel sounds are normal.  ?   Palpations: Abdomen is soft.  ?Neurological:  ?   Mental Status: She is alert and oriented to person, place, and time.  ?   Gait: Gait normal.  ?Psychiatric:     ?   Mood and Affect: Mood normal.     ?   Behavior: Behavior normal.     ?   Thought Content: Thought content normal.  ? ? ?No results found for any visits on 02/11/22. ? ? ?   ?Assessment & Plan:  ? ?Problem List Items Addressed This Visit   ?None ? ? ?No orders of the defined types were placed in this encounter. ? ? ?Return in about 3 months (around 05/14/2022), or if symptoms worsen or fail to improve. ? ?07/14/2022, FNP ? ? ?

## 2022-02-15 IMAGING — US US MFM FETAL BPP W/O NON-STRESS
1 series · 15 of 21 positions shown · non-contrast
Comparison: none

[Series 1: us mfm fetal bpp w/o non-stress · 21 acquisitions, 15 frames shown]
[im 1/21]
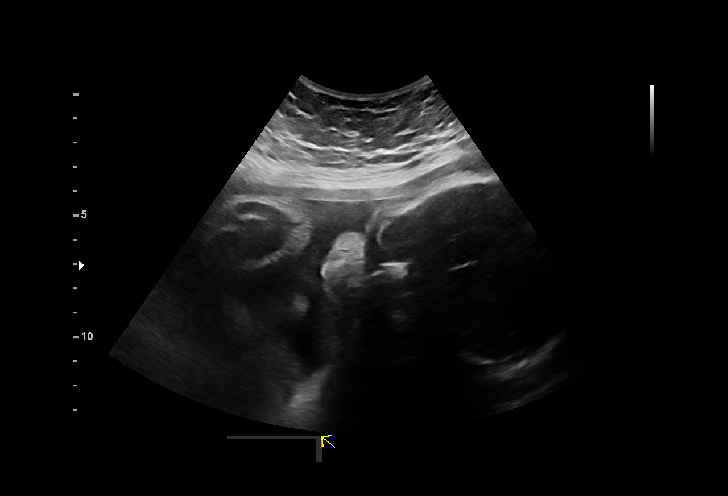
[im 3/21]
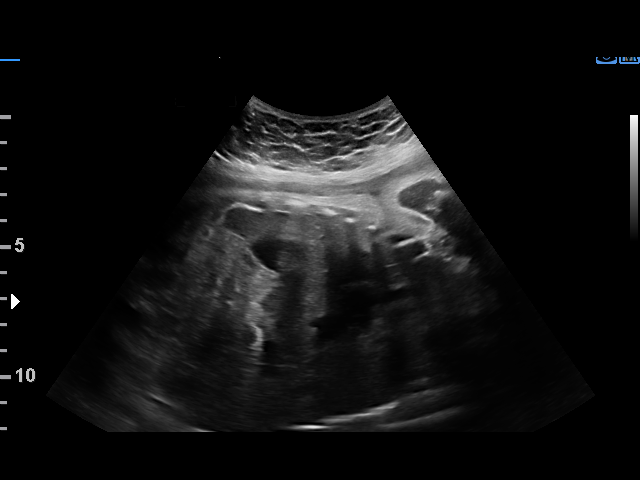
[im 4/21]
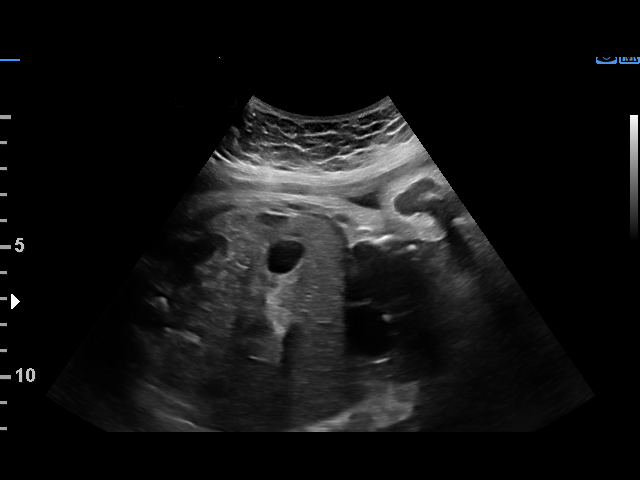
[im 5/21]
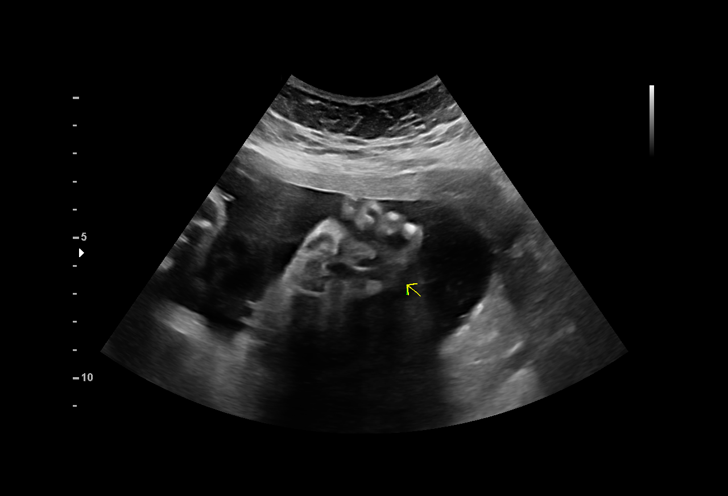
[im 7/21]
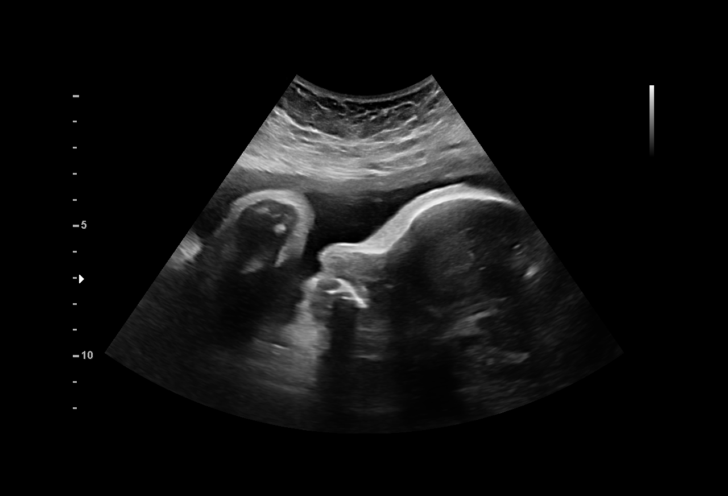
[im 8/21]
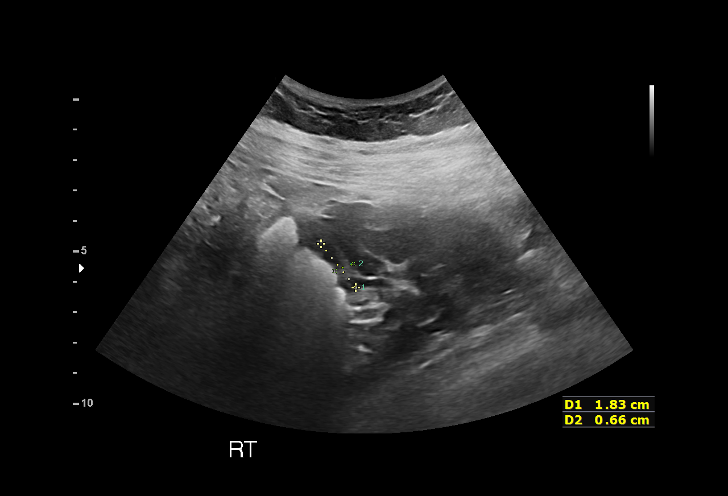
[im 10/21]
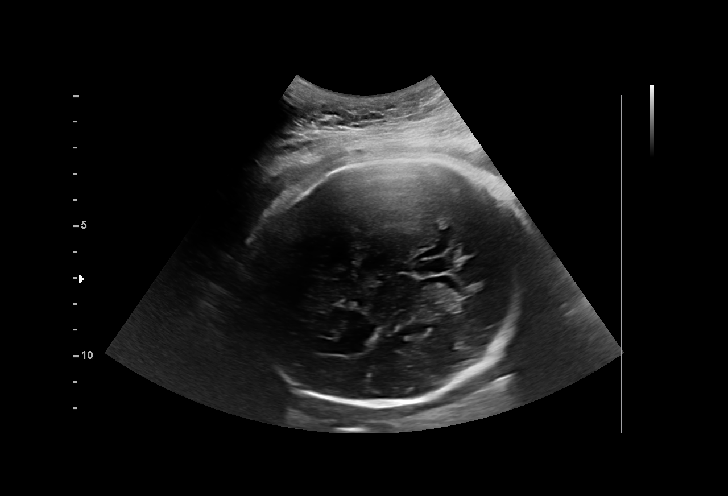
[im 11/21]
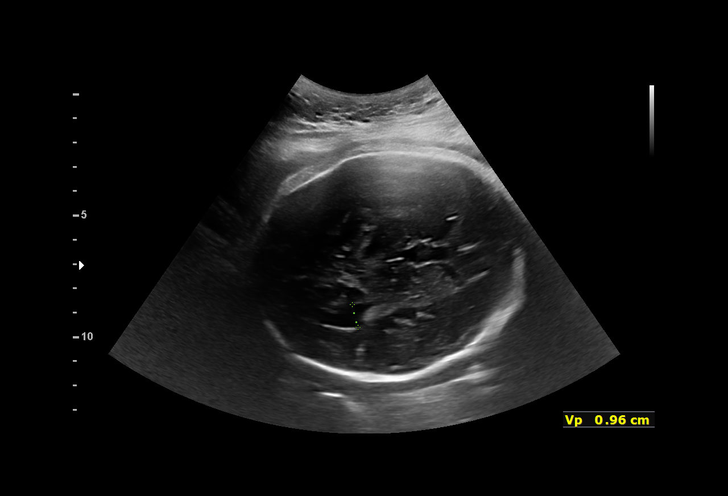
[im 12/21]
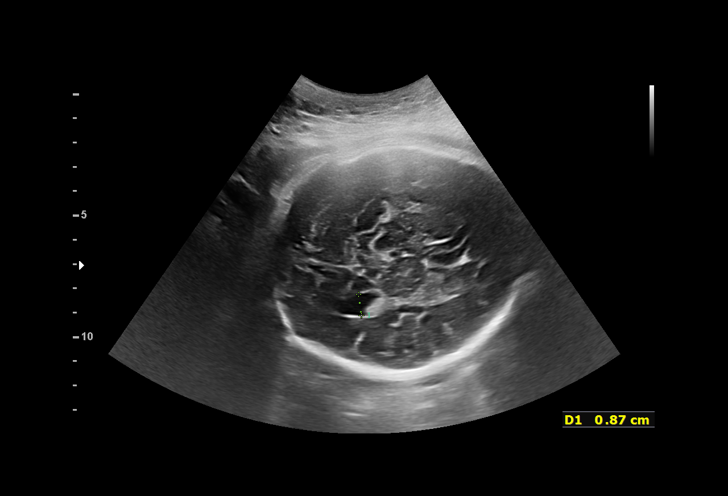
[im 14/21]
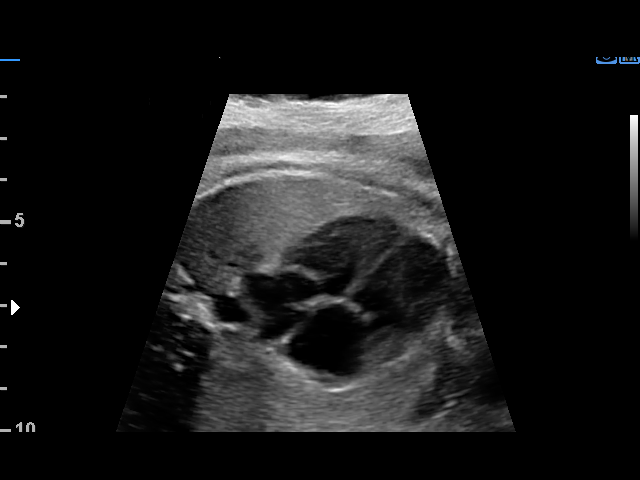
[im 15/21]
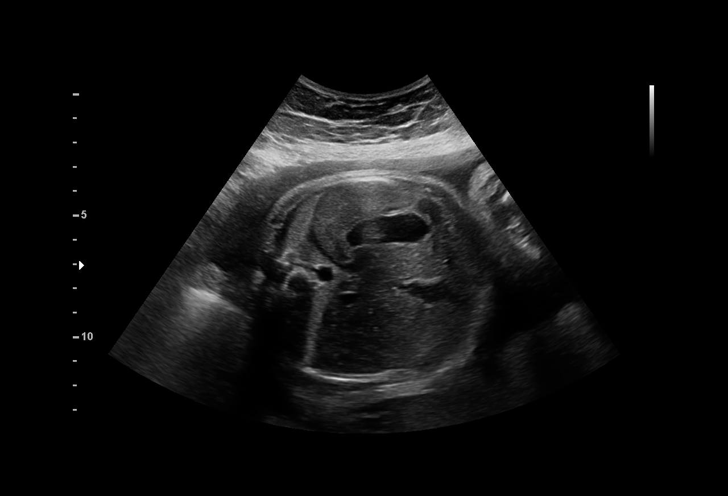
[im 17/21]
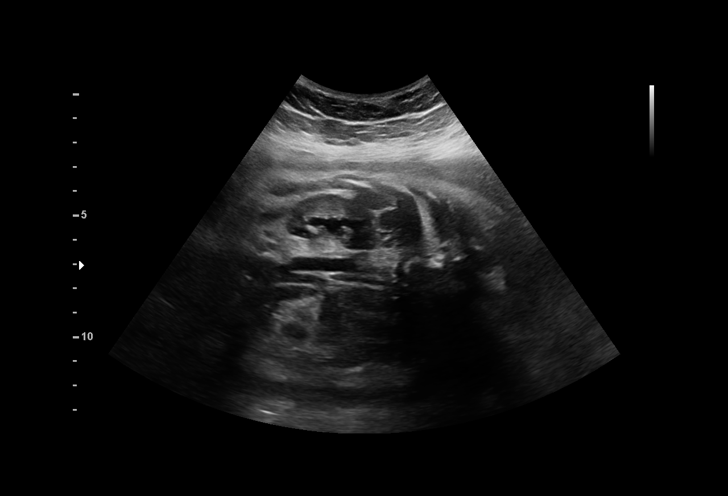
[im 18/21]
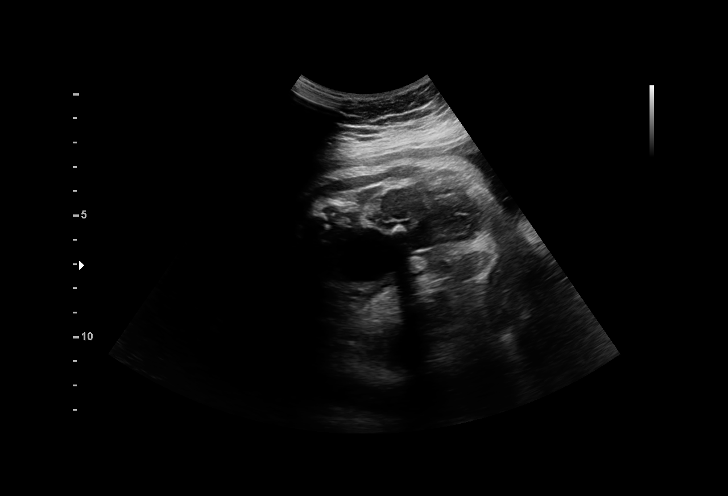
[im 19/21]
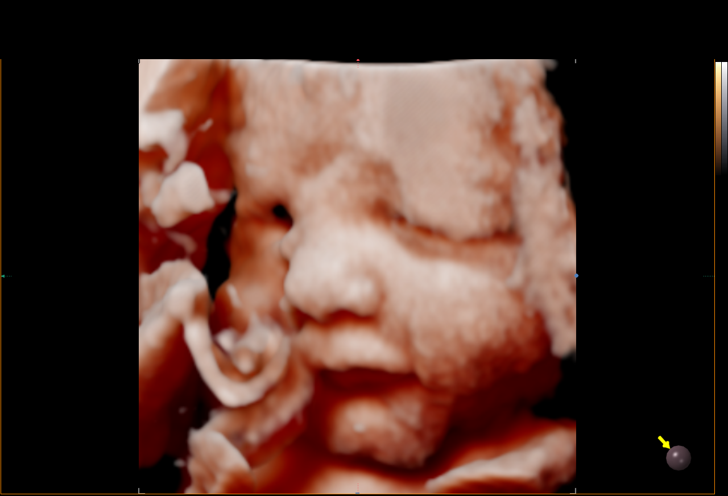
[im 21/21]
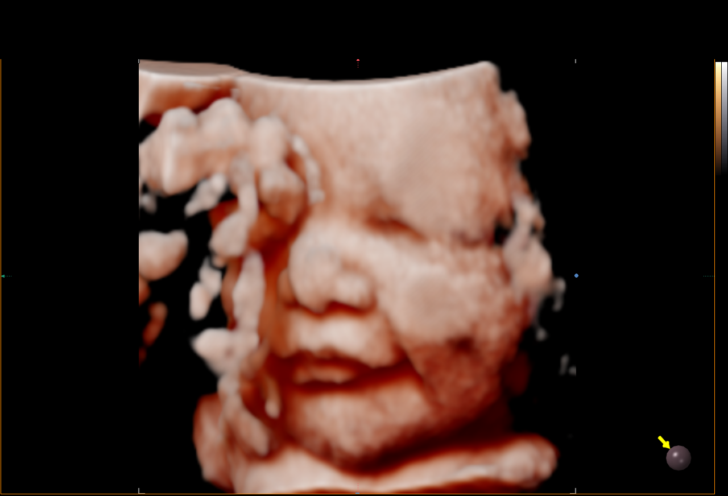

[15 of 21 positions shown; findings below may reference images not displayed]

OB/Gyn and
                                                            Infertility
                   OB/Gyn

Indications

 35 weeks gestation of pregnancy
 Gestational diabetes in pregnancy, insulin
 controlled
 Obesity complicating pregnancy, third
 trimester (Pregravid BMI 34)
 Advanced maternal age multigravida 35+,
 third trimester (34 yrs)
 Encounter for other antenatal screening
 follow-up
 Low Risk NIPS(Negative AFP)
Fetal Evaluation

 Num Of Fetuses:         1
 Fetal Heart Rate(bpm):  138
 Cardiac Activity:       Observed
 Presentation:           Cephalic
 Placenta:               Posterior
 P. Cord Insertion:      Previously Visualized
 Amniotic Fluid
 AFI FV:      Within normal limits

 AFI Sum(cm)     %Tile       Largest Pocket(cm)
 19.36           72

 RUQ(cm)       RLQ(cm)       LUQ(cm)        LLQ(cm)

Biophysical Evaluation

 Amniotic F.V:   Within normal limits       F. Tone:        Observed
 F. Movement:    Observed                   Score:          [DATE]
 F. Breathing:   Observed
Biometry

 LV:          9  mm
OB History

 Gravidity:    3         Term:   2        Prem:   0        SAB:   0
 TOP:          0       Ectopic:  0        Living: 2
Gestational Age

 LMP:           35w 3d        Date:  08/23/20                 EDD:   05/30/21
 Best:          35w 3d     Det. By:  LMP  (08/23/20)          EDD:   05/30/21
Anatomy

 Ventricles:            Appears normal         Kidneys:                Appear normal
 Heart:                 Appears normal         Bladder:                Appears normal
                        (4CH, axis, and
                        situs)
 Stomach:               Appears normal, left
                        sided
Cervix Uterus Adnexa

 Cervix
 Not visualized (advanced GA >00wks)

 Right Ovary
 Visualized.

 Left Ovary
 Visualized.
Impression

 Gestational diabetes.  Patient takes insulin for control.

 Amniotic fluid is normal and good fetal activity is seen
 .Antenatal testing is reassuring. BPP [DATE].
Recommendations

 Continue weekly antenatal testing till delivery .
                 De Lima, Akhilesh

## 2022-04-22 ENCOUNTER — Encounter: Payer: Self-pay | Admitting: Nurse Practitioner

## 2022-04-22 ENCOUNTER — Ambulatory Visit (INDEPENDENT_AMBULATORY_CARE_PROVIDER_SITE_OTHER): Payer: Medicaid Other | Admitting: Nurse Practitioner

## 2022-04-22 ENCOUNTER — Ambulatory Visit: Payer: Medicaid Other | Admitting: Nurse Practitioner

## 2022-04-22 VITALS — BP 111/80 | HR 94 | Temp 98.0°F | Ht 64.8 in | Wt 205.0 lb

## 2022-04-22 DIAGNOSIS — Z0000 Encounter for general adult medical examination without abnormal findings: Secondary | ICD-10-CM | POA: Diagnosis not present

## 2022-04-22 DIAGNOSIS — Z83438 Family history of other disorder of lipoprotein metabolism and other lipidemia: Secondary | ICD-10-CM

## 2022-04-22 NOTE — Progress Notes (Cosign Needed)
Complete physical exam  Patient: Laura Sexton   DOB: [birth year]   36 y.o. Female  MRN: [id number]  Subjective:    Chief Complaint  Patient presents with   Annual Exam    Narelle Schoening is a 36 y.o. female who presents today for a complete physical exam. She reports consuming a general diet. The patient does not participate in regular exercise at present. She generally feels well. She reports sleeping well. She does have additional problems to discuss today. She is concerned with her weight.       Patient Active Problem List   Diagnosis Date Noted   AMA (advanced maternal age) multigravida 64+, third trimester 05/22/2021   Gestational diabetes mellitus (GDM) controlled on oral hypoglycemic drug 08/11/2018   Abnormal glucose tolerance test (GTT) during pregnancy, antepartum 03/15/2018   Postpartum state 01/18/2015   Indication for care in labor or delivery 01/17/2015   GDM (gestational diabetes mellitus)    [redacted] weeks gestation of pregnancy    Positive pregnancy test 06/03/2014   Abnormal finding on urinalysis 06/03/2014   Overweight 06/30/2013   Obesity, unspecified 05/14/2013   Leg swelling 01/21/2013   Screening for malignant neoplasm of the cervix 01/21/2013   Physical exam, annual 01/21/2013   Past Medical History:  Diagnosis Date   GDM (gestational diabetes mellitus) 2016   Gestational diabetes    Headache    History of gestational diabetes    History of pregnancy induced hypertension    Hyperlipidemia    IUD migration    Pneumonia    Prediabetes 2020   SUI (stress urinary incontinence, female)    Wears contact lenses    Past Surgical History:  Procedure Laterality Date   CHOLECYSTECTOMY N/A 06/18/2015   Procedure: LAPAROSCOPIC CHOLECYSTECTOMY WITH INTRAOPERATIVE CHOLANGIOGRAM;  Surgeon: Donnie Mesa, MD;  Location: [facility];  Service: General;  Laterality: N/A;   HAND SURGERY  age 66   Cyst on Left Thumb-   HYSTEROSCOPY N/A 06/15/2019   Procedure: HYSTEROSCOPY;   Surgeon: Bobbye Charleston, MD;  Location: [place];  Service: Gynecology;  Laterality: N/A;   LAPAROSCOPY N/A 06/15/2019   Procedure: DIAGNOSTIC LAPAROSCOPY, LAPAROSCOPIC REMOVAL OF INTRAUTERINE DEVICE;  Surgeon: Bobbye Charleston, MD;  Location: [facility];  Service: Gynecology;  Laterality: N/A;      Patient Care Team: [facility], Caprice Red, FNP as PCP - General (Family Medicine)    Review of Systems  Constitutional: Negative.   HENT: Negative.    Respiratory: Negative.    Cardiovascular: Negative.   Gastrointestinal: Negative.   Genitourinary:        Bladder incontinent   Musculoskeletal:        Swollen ankles   Neurological: Negative.   Psychiatric/Behavioral: Negative.         Objective:     BP 111/80 (BP Location: Left Arm, Patient Position: Sitting)   Pulse 94   Temp 98 F (36.7 C)   Ht 5' 4.8" (1.646 m)   Wt 205 lb (93 kg)   SpO2 99%   BMI 34.32 kg/m    Physical Exam Vitals reviewed.  Constitutional:      Appearance: Normal appearance.  HENT:     Right Ear: Tympanic membrane, ear canal and external ear normal.     Left Ear: Tympanic membrane, ear canal and external ear normal.     Nose: Nose normal.     Mouth/Throat:     Mouth: Mucous membranes are moist.     Pharynx: Oropharynx is  clear.  Eyes:     Extraocular Movements: Extraocular movements intact.     Conjunctiva/sclera: Conjunctivae normal.     Pupils: Pupils are equal, round, and reactive to light.  Cardiovascular:     Rate and Rhythm: Regular rhythm.     Heart sounds: Normal heart sounds.  Pulmonary:     Effort: Pulmonary effort is normal.  Abdominal:     General: Bowel sounds are normal.     Palpations: Abdomen is soft.  Musculoskeletal:        General: Swelling present.     Cervical back: Normal range of motion and neck supple.  Skin:    General: Skin is dry.  Neurological:     General: No focal deficit present.     Mental Status: She is alert and  oriented to person, place, and time.  Psychiatric:        Mood and Affect: Mood normal.        Behavior: Behavior normal.        Thought Content: Thought content normal.      Results for orders placed or performed in visit on 04/22/22  CBC  Result Value Ref Range   WBC 9.0 3.4 - 10.8 x10E3/uL   RBC 4.59 3.77 - 5.28 x10E6/uL   Hemoglobin 11.1 11.1 - 15.9 g/dL   Hematocrit 34.7 34.0 - 46.6 %   MCV 76 (L) 79 - 97 fL   MCH 24.2 (L) 26.6 - 33.0 pg   MCHC 32.0 31.5 - 35.7 g/dL   RDW 15.1 11.7 - 15.4 %   Platelets 252 150 - 450 x10E3/uL  Comprehensive Metabolic Panel (CMET)  Result Value Ref Range   Glucose CANCELED mg/dL   BUN 18 6 - 20 mg/dL   Creatinine, Ser 0.60 0.57 - 1.00 mg/dL   eGFR 118 >59 mL/min/1.73   BUN/Creatinine Ratio 30 (H) 9 - 23   Sodium 140 134 - 144 mmol/L   Potassium CANCELED mmol/L   Chloride 103 96 - 106 mmol/L   CO2 18 (L) 20 - 29 mmol/L   Calcium 9.9 8.7 - 10.2 mg/dL   Total Protein 7.3 6.0 - 8.5 g/dL   Albumin 4.5 3.9 - 4.9 g/dL   Globulin, Total 2.8 1.5 - 4.5 g/dL   Albumin/Globulin Ratio 1.6 1.2 - 2.2   Bilirubin Total 0.2 0.0 - 1.2 mg/dL   Alkaline Phosphatase 66 44 - 121 IU/L   AST 17 0 - 40 IU/L   ALT 27 0 - 32 IU/L  Lipid Profile  Result Value Ref Range   Cholesterol, Total 257 (H) 100 - 199 mg/dL   Triglycerides 256 (H) 0 - 149 mg/dL   HDL 45 >39 mg/dL   VLDL Cholesterol Cal 48 (H) 5 - 40 mg/dL   LDL Chol Calc (NIH) 164 (H) 0 - 99 mg/dL   Chol/HDL Ratio 5.7 (H) 0.0 - 4.4 ratio  TSH  Result Value Ref Range   TSH 3.180 0.450 - 4.500 uIU/mL  HgB A1c  Result Value Ref Range   Hgb A1c MFr Bld 6.0 (H) 4.8 - 5.6 %   Est. average glucose Bld gHb Est-mCnc 126 mg/dL       Assessment & Plan:    Routine Health Maintenance and Physical Exam  There is no immunization history for the selected administration types on file for this patient.  Health Maintenance  Topic Date Due   COVID-19 Vaccine (1) Never done   FOOT EXAM  Never done    OPHTHALMOLOGY EXAM  Never done   URINE MICROALBUMIN  Never done   Hepatitis C Screening  Never done   PAP SMEAR-Modifier  07/02/2017   INFLUENZA VACCINE  05/11/2022   HEMOGLOBIN A1C  10/23/2022   TETANUS/TDAP  03/04/2031   HIV Screening  Completed   HPV VACCINES  Aged Out    Discussed health benefits of physical activity, and encouraged her to engage in regular exercise appropriate for her age and condition.  Problem List Items Addressed This Visit    -Labs for CBP, CMP, TSH, A1C, and Lipid panel -Proper diet and exercise -Portioned meals high in protein and fiber, low in fats, sugar, and carbohydrates.  -Exercise at least 30 minutes a day  -To possibly begin on medication for Hyperlipidemia after lab results  Return in about 3 months (around 07/23/2022), or As needed.     Charleen Kirks, FNP

## 2022-04-23 LAB — CBC
Hematocrit: 34.7 % (ref 34.0–46.6)
Hemoglobin: 11.1 g/dL (ref 11.1–15.9)
MCH: 24.2 pg — ABNORMAL LOW (ref 26.6–33.0)
MCHC: 32 g/dL (ref 31.5–35.7)
MCV: 76 fL — ABNORMAL LOW (ref 79–97)
Platelets: 252 10*3/uL (ref 150–450)
RBC: 4.59 x10E6/uL (ref 3.77–5.28)
RDW: 15.1 % (ref 11.7–15.4)
WBC: 9 10*3/uL (ref 3.4–10.8)

## 2022-04-23 LAB — COMPREHENSIVE METABOLIC PANEL
ALT: 27 IU/L (ref 0–32)
AST: 17 IU/L (ref 0–40)
Albumin/Globulin Ratio: 1.6 (ref 1.2–2.2)
Albumin: 4.5 g/dL (ref 3.9–4.9)
Alkaline Phosphatase: 66 IU/L (ref 44–121)
BUN/Creatinine Ratio: 30 — ABNORMAL HIGH (ref 9–23)
BUN: 18 mg/dL (ref 6–20)
Bilirubin Total: 0.2 mg/dL (ref 0.0–1.2)
CO2: 18 mmol/L — ABNORMAL LOW (ref 20–29)
Calcium: 9.9 mg/dL (ref 8.7–10.2)
Chloride: 103 mmol/L (ref 96–106)
Creatinine, Ser: 0.6 mg/dL (ref 0.57–1.00)
Globulin, Total: 2.8 g/dL (ref 1.5–4.5)
Sodium: 140 mmol/L (ref 134–144)
Total Protein: 7.3 g/dL (ref 6.0–8.5)
eGFR: 118 mL/min/{1.73_m2} (ref >59)

## 2022-04-23 LAB — LIPID PANEL
Chol/HDL Ratio: 5.7 ratio — ABNORMAL HIGH (ref 0.0–4.4)
Cholesterol, Total: 257 mg/dL — ABNORMAL HIGH (ref 100–199)
HDL: 45 mg/dL (ref >39)
LDL Chol Calc (NIH): 164 mg/dL — ABNORMAL HIGH (ref 0–99)
Triglycerides: 256 mg/dL — ABNORMAL HIGH (ref 0–149)
VLDL Cholesterol Cal: 48 mg/dL — ABNORMAL HIGH (ref 5–40)

## 2022-04-23 LAB — TSH: TSH: 3.18 u[IU]/mL (ref 0.450–4.500)

## 2022-04-23 LAB — HEMOGLOBIN A1C
Est. average glucose Bld gHb Est-mCnc: 126 mg/dL
Hgb A1c MFr Bld: 6 % — ABNORMAL HIGH (ref 4.8–5.6)

## 2022-04-29 ENCOUNTER — Encounter: Payer: Self-pay | Admitting: Nurse Practitioner

## 2022-04-29 ENCOUNTER — Other Ambulatory Visit: Payer: Self-pay | Admitting: Nurse Practitioner

## 2022-04-29 DIAGNOSIS — E782 Mixed hyperlipidemia: Secondary | ICD-10-CM

## 2022-04-29 MED ORDER — ATORVASTATIN CALCIUM 20 MG PO TABS: 20.0000 mg | ORAL_TABLET | Freq: Every day | ORAL | 3 refills | Status: DC

## 2022-05-03 ENCOUNTER — Encounter: Payer: Self-pay | Admitting: Nurse Practitioner

## 2022-06-23 ENCOUNTER — Ambulatory Visit: Payer: Medicaid Other | Admitting: Nurse Practitioner

## 2022-09-14 ENCOUNTER — Ambulatory Visit: Payer: Medicaid Other | Admitting: Nurse Practitioner

## 2022-09-23 ENCOUNTER — Encounter: Payer: Self-pay | Admitting: Family Medicine

## 2022-09-23 ENCOUNTER — Ambulatory Visit: Payer: Medicaid Other | Admitting: Family Medicine

## 2022-09-23 VITALS — BP 118/87 | HR 90 | Temp 97.9°F | Ht 64.0 in | Wt 213.0 lb

## 2022-09-23 DIAGNOSIS — F900 Attention-deficit hyperactivity disorder, predominantly inattentive type: Secondary | ICD-10-CM

## 2022-09-23 DIAGNOSIS — D500 Iron deficiency anemia secondary to blood loss (chronic): Secondary | ICD-10-CM

## 2022-09-23 DIAGNOSIS — E785 Hyperlipidemia, unspecified: Secondary | ICD-10-CM

## 2022-09-23 DIAGNOSIS — R7303 Prediabetes: Secondary | ICD-10-CM

## 2022-09-23 LAB — POCT URINE DRUG SCREEN
Methylenedioxyamphetamine: NOT DETECTED
POC Amphetamine UR: NOT DETECTED
POC BENZODIAZEPINES UR: NOT DETECTED
POC Barbiturate UR: NOT DETECTED
POC Cocaine UR: NOT DETECTED
POC Ecstasy UR: NOT DETECTED
POC Marijuana UR: NOT DETECTED
POC Methadone UR: NOT DETECTED
POC Methamphetamine UR: NOT DETECTED
POC Opiate Ur: NOT DETECTED
POC Oxycodone UR: NOT DETECTED
POC PHENCYCLIDINE UR: NOT DETECTED
POC TRICYCLICS UR: NOT DETECTED

## 2022-09-23 LAB — POCT URINE PREGNANCY: Preg Test, Ur: NEGATIVE

## 2022-09-23 MED ORDER — METHYLPHENIDATE HCL 10 MG PO TABS: 10.0000 mg | ORAL_TABLET | Freq: Two times a day (BID) | ORAL | 0 refills | Status: DC

## 2022-09-23 NOTE — Progress Notes (Unsigned)
   Established Patient Office Visit  Subjective   Patient ID: Laura Sexton, female    DOB: May 27, 1986  Age: 36 y.o. MRN: [id number]  Chief Complaint  Patient presents with   Obesity    Pt. Is here for f/u on A1c, weight loss clearance and adhd    HPI  {History (Optional):23778}  ROS    Objective:     BP 118/87   Pulse 90   Temp 97.9 F (36.6 C)   Ht 5\' 4"  (1.626 m)   Wt 213 lb (96.6 kg)   SpO2 100%   BMI 36.56 kg/m  {Vitals History (Optional):23777}  Physical Exam   No results found for any visits on 09/23/22.  {Labs (Optional):23779}  The ASCVD Risk score (Arnett DK, et al., 2019) failed to calculate for the following reasons:   The 2019 ASCVD risk score is only valid for ages 11 to 40    Assessment & Plan:   Problem List Items Addressed This Visit   None Visit Diagnoses     Attention deficit hyperactivity disorder (ADHD), predominantly inattentive type    -  Primary   Relevant Medications   methylphenidate (RITALIN) 10 MG tablet   Other Relevant Orders   POCT Urine Drug Screen   POCT urine pregnancy   Hyperlipidemia, unspecified hyperlipidemia type       Relevant Orders   Lipid Profile   Prediabetes       Relevant Orders   HgB A1c   Blood loss anemia       Relevant Orders   CBC with Differential       Return in about 1 month (around 10/24/2022) for Feet Swelling - labs and referrals, review [facility].    10/26/2022, MD

## 2022-09-24 LAB — LIPID PANEL
Chol/HDL Ratio: 4.1 ratio (ref 0.0–4.4)
Cholesterol, Total: 191 mg/dL (ref 100–199)
HDL: 47 mg/dL (ref >39)
LDL Chol Calc (NIH): 104 mg/dL — ABNORMAL HIGH (ref 0–99)
Triglycerides: 234 mg/dL — ABNORMAL HIGH (ref 0–149)
VLDL Cholesterol Cal: 40 mg/dL (ref 5–40)

## 2022-09-24 LAB — CBC WITH DIFFERENTIAL/PLATELET
Basophils Absolute: 0 10*3/uL (ref 0.0–0.2)
Basos: 0 %
EOS (ABSOLUTE): 0.2 10*3/uL (ref 0.0–0.4)
Eos: 2 %
Hematocrit: 34.3 % (ref 34.0–46.6)
Hemoglobin: 11.2 g/dL (ref 11.1–15.9)
Immature Grans (Abs): 0 10*3/uL (ref 0.0–0.1)
Immature Granulocytes: 0 %
Lymphocytes Absolute: 3.7 10*3/uL — ABNORMAL HIGH (ref 0.7–3.1)
Lymphs: 35 %
MCH: 26 pg — ABNORMAL LOW (ref 26.6–33.0)
MCHC: 32.7 g/dL (ref 31.5–35.7)
MCV: 80 fL (ref 79–97)
Monocytes Absolute: 0.5 10*3/uL (ref 0.1–0.9)
Monocytes: 5 %
Neutrophils Absolute: 6.1 10*3/uL (ref 1.4–7.0)
Neutrophils: 58 %
Platelets: 298 10*3/uL (ref 150–450)
RBC: 4.3 x10E6/uL (ref 3.77–5.28)
RDW: 14.1 % (ref 11.7–15.4)
WBC: 10.5 10*3/uL (ref 3.4–10.8)

## 2022-09-24 LAB — HEMOGLOBIN A1C
Est. average glucose Bld gHb Est-mCnc: 114 mg/dL
Hgb A1c MFr Bld: 5.6 % (ref 4.8–5.6)

## 2022-10-19 IMAGING — CT CT RENAL STONE PROTOCOL
2 of 4 series · 16 of 46 positions shown, 18 images · non-contrast
Comparison: 12/22/2021

CLINICAL DATA: Flank pain.  Kidney stone suspected.



[Series 2: axial st · axial · 0.89mm/px · z∈[-504,-54]mm · 13 of 100 slices shown, 15 images]
[im 5/100  soft-tissue]
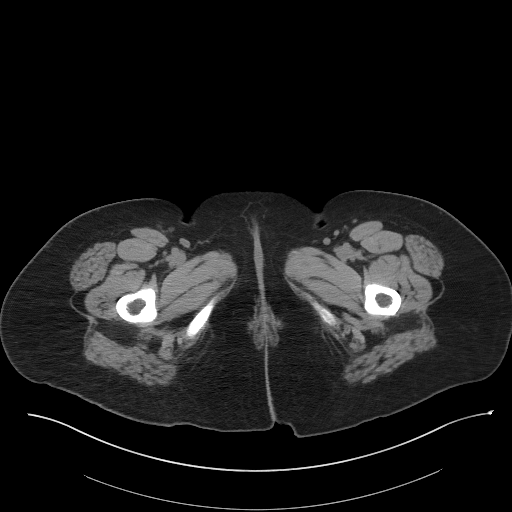
[im 5/100  bone]
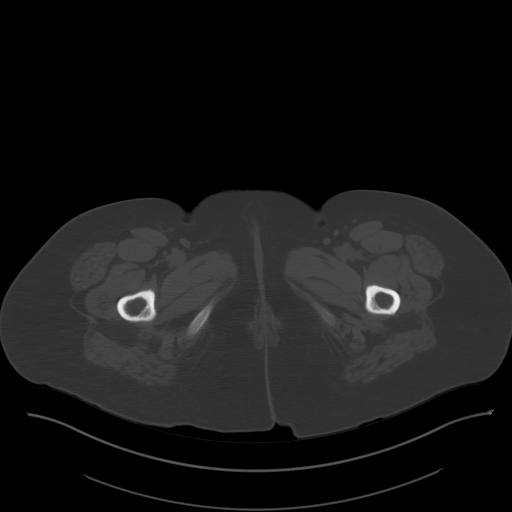
[im 13/100  soft-tissue]
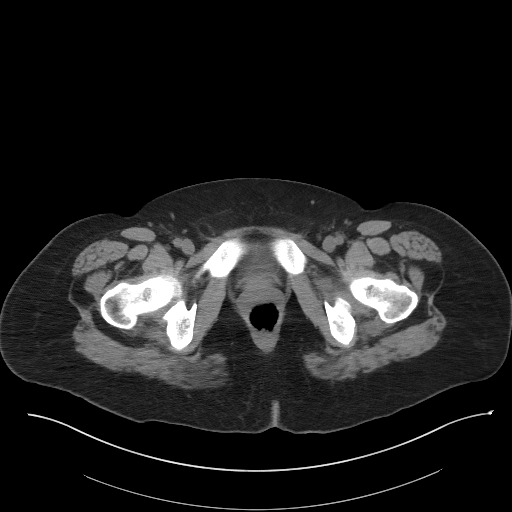
[im 22/100  soft-tissue]
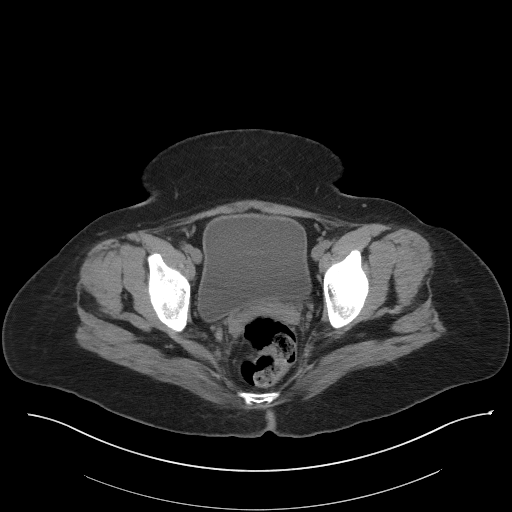
[im 26/100  soft-tissue]
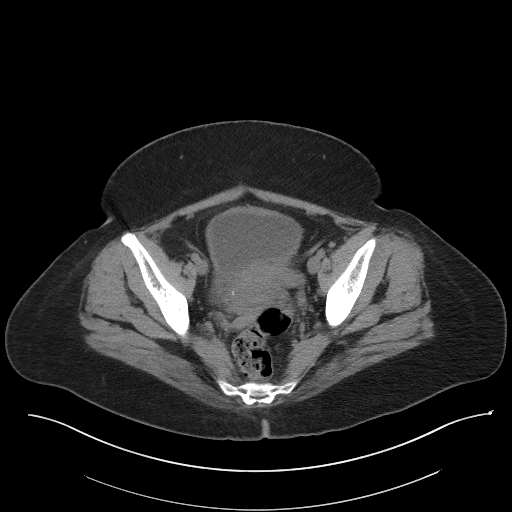
[im 35/100  soft-tissue]
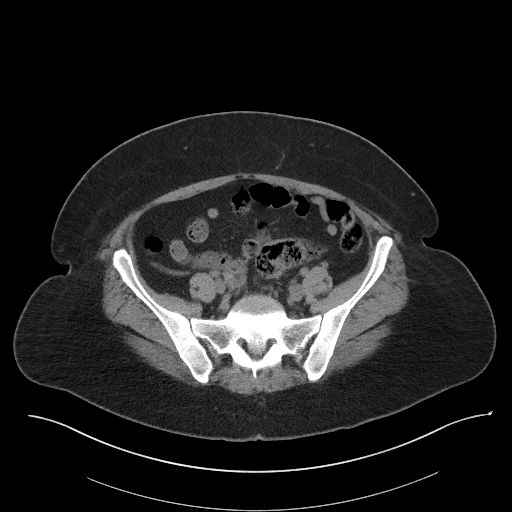
[im 44/100  soft-tissue]
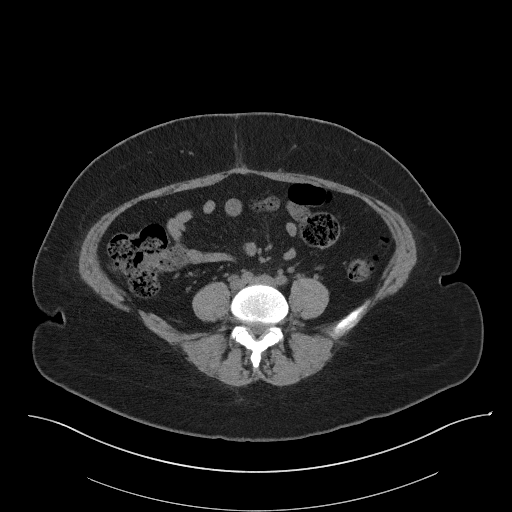
[im 52/100  soft-tissue]
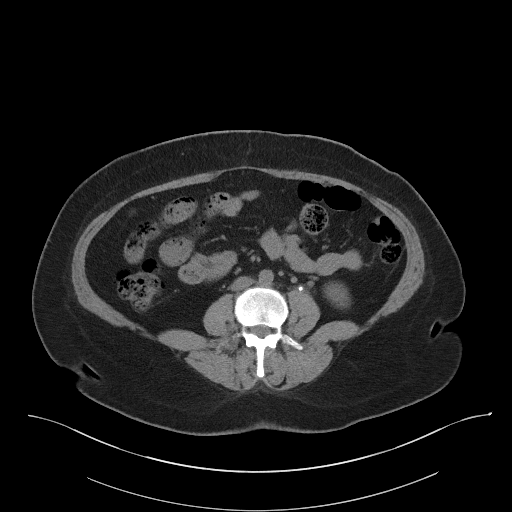
[im 56/100  soft-tissue]
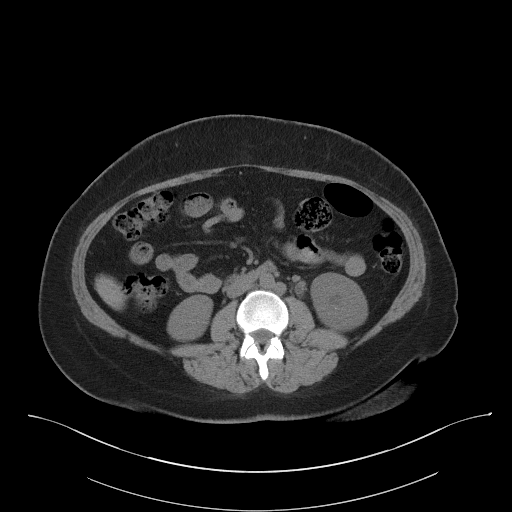
[im 65/100  soft-tissue]
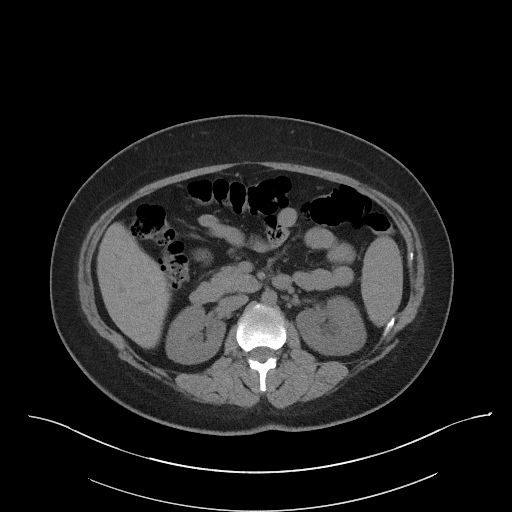
[im 65/100  bone]
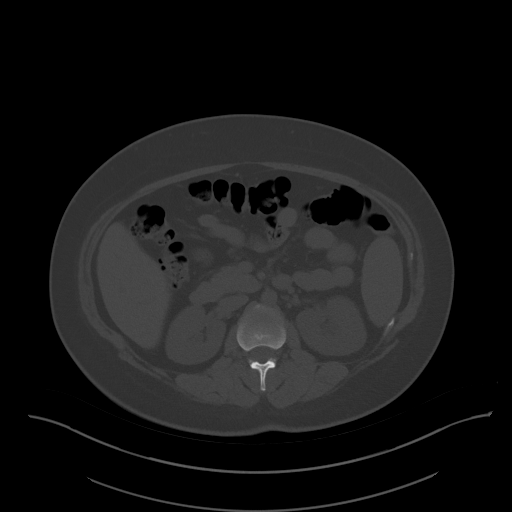
[im 74/100  soft-tissue]
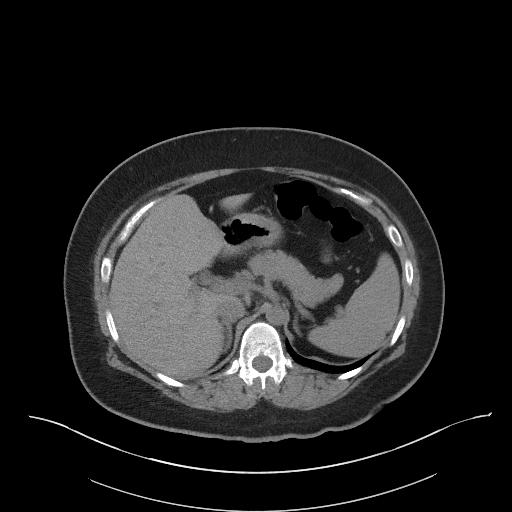
[im 78/100  soft-tissue]
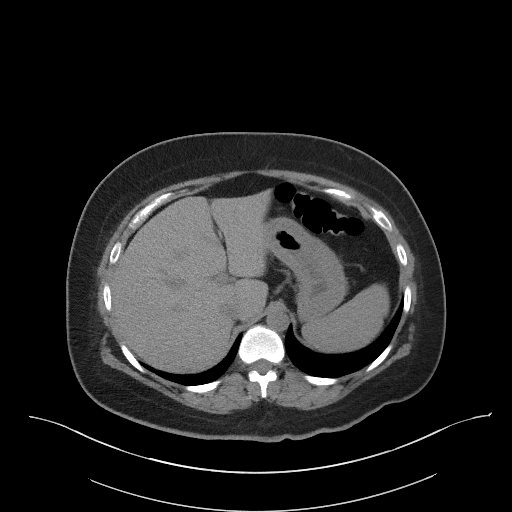
[im 87/100  soft-tissue]
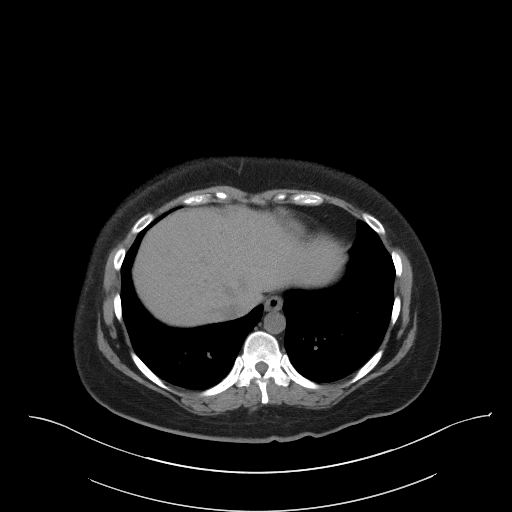
[im 95/100  soft-tissue]
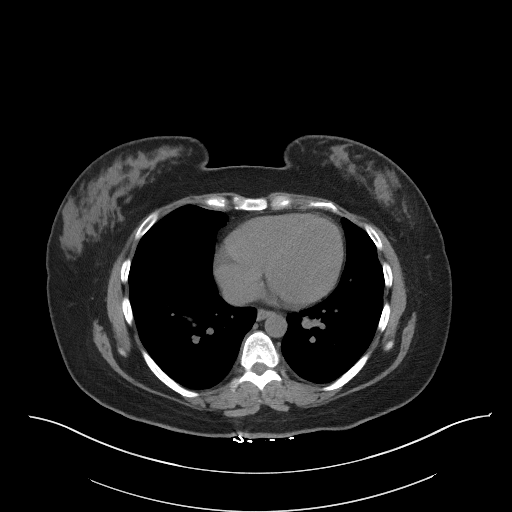

[Series 5: coronal st · coronal · 0.73mm/px · 3 of 96 slices shown]
[im 32/96  soft-tissue]
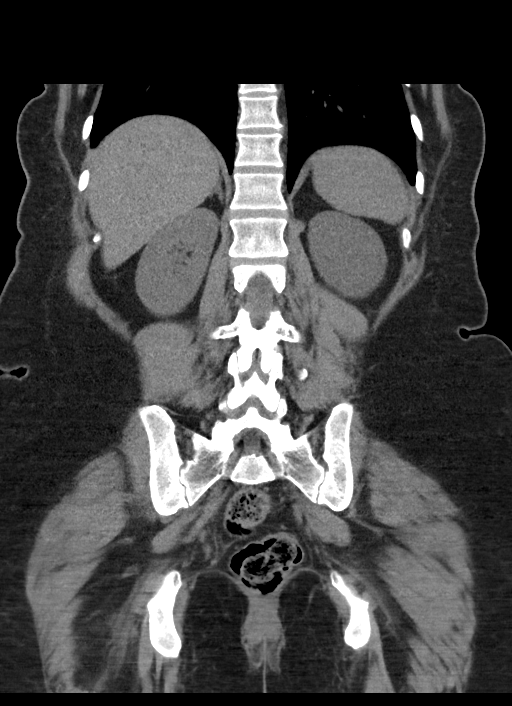
[im 43/96  soft-tissue]
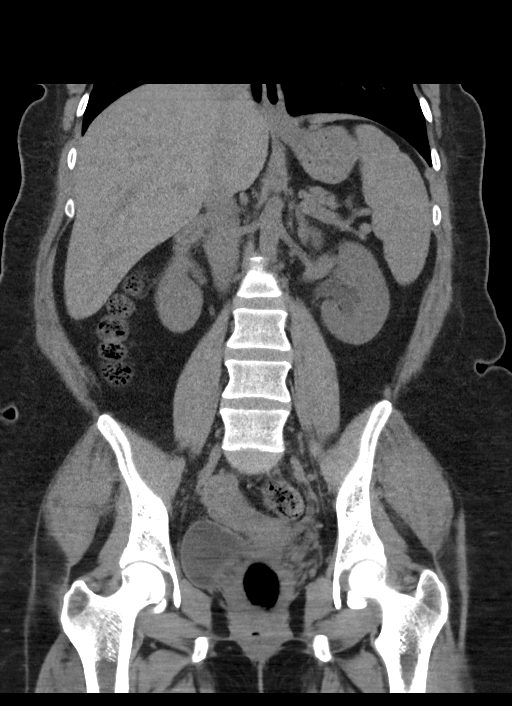
[im 53/96  soft-tissue]
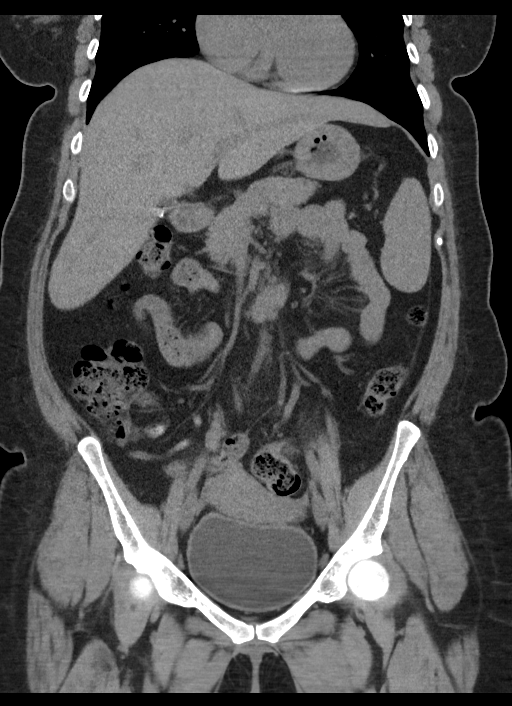

[16 of 46 positions shown; findings below may reference images not displayed]

FINDINGS: Lower chest: Normal

Hepatobiliary: Previous cholecystectomy. Liver parenchyma is normal.

Pancreas: Normal

Spleen: Normal

Adrenals/Urinary Tract: Adrenal glands are normal. Right kidney is
normal. No cyst, stone, mass or hydronephrosis. Left kidney
continues to show swelling and hydronephrosis. 1 mm nonobstructing
stone in the lower pole. There has been only slight downward
progression the proximal ureteral calculus on the left. The stone
measures 7-8 mm in length with a transverse dimension of 3 mm. No
stone in the bladder.

Stomach/Bowel: Stomach and small intestine are normal. Moderate
amount of fecal matter throughout the colon.

Vascular/Lymphatic: Aorta and IVC are normal.  No adenopathy.

Reproductive: Normal

Other: No free fluid or air.

Musculoskeletal: Normal
IMPRESSION: Previously seen stone in the proximal left ureter has only advanced
1 or 2 cm since the prior CT of 8 days ago. This stone is 7-8 mm in
length with a transverse diameter of 3 mm. Degree of hydronephrosis
on the left is mild-to-moderate.

## 2022-10-26 ENCOUNTER — Ambulatory Visit: Payer: Medicaid Other | Admitting: Family Medicine

## 2022-11-05 ENCOUNTER — Ambulatory Visit (INDEPENDENT_AMBULATORY_CARE_PROVIDER_SITE_OTHER): Payer: Medicaid Other | Admitting: Family Medicine

## 2022-11-05 ENCOUNTER — Ambulatory Visit: Payer: Medicaid Other | Admitting: Family Medicine

## 2022-11-05 ENCOUNTER — Encounter: Payer: Self-pay | Admitting: Family Medicine

## 2022-11-05 VITALS — BP 116/82 | HR 103 | Temp 97.8°F | Resp 16 | Ht 64.0 in | Wt 207.8 lb

## 2022-11-05 DIAGNOSIS — F900 Attention-deficit hyperactivity disorder, predominantly inattentive type: Secondary | ICD-10-CM

## 2022-11-05 DIAGNOSIS — E782 Mixed hyperlipidemia: Secondary | ICD-10-CM

## 2022-11-05 DIAGNOSIS — Z87898 Personal history of other specified conditions: Secondary | ICD-10-CM

## 2022-11-08 MED ORDER — METHYLPHENIDATE HCL 20 MG PO TABS: 20.0000 mg | ORAL_TABLET | Freq: Every morning | ORAL | 0 refills | Status: DC

## 2022-11-08 MED ORDER — ATORVASTATIN CALCIUM 20 MG PO TABS: 20.0000 mg | ORAL_TABLET | Freq: Every day | ORAL | 3 refills | Status: AC

## 2022-11-08 NOTE — Progress Notes (Signed)
Established Patient Office Visit  Subjective   Patient ID: Laura Sexton, female    DOB: 11-May-1986  Age: 37 y.o. MRN: [id number]  Chief Complaint  Patient presents with   Diabetes    Patient presents for type 2 diabetes follow-up, patient prediabetic.  Patient states that she has made no changes to her diet or exercise.  Patient states she would like to exercise starting with the new year if she can send it into her schedule.  Hyperlipidemia, patient consistent with the medication for this.  No muscle aches or pains.  No recent labs or transaminitis.  No history of muscle weakness.  Patient with history of adult ADHD.  Recently her medications have had a mild side effect of insomnia.  Patient feels that taking a second dose of the medication is making her sleep poor.  Patient is requesting a dose or other changes to help prevent this.  Patient states the issue was not with sleep onset but with duration.  After patient wakes up he is unable to go back to sleep.  No tachycardia.  No decreased appetite.  No weight loss.  No chest pain.  Diabetes      Review of Systems  All other systems reviewed and are negative.     Objective:     BP 116/82 (BP Location: Left Arm, Patient Position: Sitting)   Pulse (!) 103   Temp 97.8 F (36.6 C) (Temporal)   Resp 16   Ht 5\' 4"  (1.626 m)   Wt 207 lb 12.8 oz (94.3 kg)   SpO2 99%   BMI 35.67 kg/m    Physical Exam Vitals and nursing note reviewed.  Constitutional:      General: She is not in acute distress.    Appearance: Normal appearance. She is not ill-appearing.  HENT:     Head: Normocephalic and atraumatic.     Mouth/Throat:     Mouth: Mucous membranes are moist.     Pharynx: Oropharynx is clear.  Eyes:     Extraocular Movements: Extraocular movements intact.     Pupils: Pupils are equal, round, and reactive to light.  Cardiovascular:     Rate and Rhythm: Normal rate and regular rhythm.     Heart sounds: No murmur heard.     No friction rub. No gallop.  Pulmonary:     Effort: Pulmonary effort is normal.     Breath sounds: Normal breath sounds.  Abdominal:     General: Abdomen is flat.     Palpations: Abdomen is soft.     Tenderness: There is no abdominal tenderness.  Skin:    General: Skin is warm.     Capillary Refill: Capillary refill takes less than 2 seconds.     Findings: No rash.  Neurological:     General: No focal deficit present.     Mental Status: She is alert and oriented to person, place, and time.  Psychiatric:        Mood and Affect: Mood normal.        Behavior: Behavior normal.      No results found for any visits on 11/05/22.    The ASCVD Risk score (Arnett DK, et al., 2019) failed to calculate for the following reasons:   The 2019 ASCVD risk score is only valid for ages 55 to 47    Assessment & Plan:   Problem List Items Addressed This Visit   None Visit Diagnoses     History of prediabetes    -  Primary   Mixed hyperlipidemia       Relevant Medications   atorvastatin (LIPITOR) 20 MG tablet   Attention deficit hyperactivity disorder (ADHD), predominantly inattentive type       Relevant Medications   methylphenidate (RITALIN) 20 MG tablet      -Patient based on her labs and no longer prediabetic.  We discussed the DASH diet briefly.  I encouraged her to try this preauricularly along with exercise to help with weight loss and management of her blood sugar.  -Will continue with patient Lipitor.  This medication was refilled.  No issues.  Weight loss will help with better control.  Possibly wean patient off medication in future.  -Patient's ADHD appears to be controlled but patient is having symptoms from medication.  Try after discussion with patient doubling morning dose with no evening dose.  Discussed briefly medications to try to control patient's insomnia.  But at this point changing the ADHD medication may be the more pertinent change.  Possibly adding an agent is  something warranted on follow-up.  Return in about 1 month (around 12/06/2022) for ADHD.    Elwin Mocha, MD

## 2022-11-16 ENCOUNTER — Other Ambulatory Visit: Payer: Self-pay

## 2022-12-07 ENCOUNTER — Ambulatory Visit: Payer: Medicaid Other | Admitting: Family Medicine

## 2022-12-07 VITALS — BP 121/86 | HR 89 | Temp 97.1°F | Ht 65.0 in | Wt 209.0 lb

## 2022-12-07 DIAGNOSIS — M25562 Pain in left knee: Secondary | ICD-10-CM

## 2022-12-07 DIAGNOSIS — M25473 Effusion, unspecified ankle: Secondary | ICD-10-CM

## 2022-12-07 DIAGNOSIS — R000 Tachycardia, unspecified: Secondary | ICD-10-CM

## 2022-12-07 DIAGNOSIS — F900 Attention-deficit hyperactivity disorder, predominantly inattentive type: Secondary | ICD-10-CM

## 2022-12-07 MED ORDER — METHYLPHENIDATE HCL 20 MG PO TABS: 20.0000 mg | ORAL_TABLET | Freq: Every morning | ORAL | 0 refills | Status: DC

## 2022-12-07 NOTE — Progress Notes (Unsigned)
   Established Patient Office Visit  Subjective   Patient ID: Laura Sexton, female    DOB: 12/16/1985  Age: 37 y.o. MRN: [id number]  Chief Complaint  Patient presents with   ADHD    Pt. Is here for refills.     HPI  {History (Optional):23778}  ROS    Objective:     BP 121/86   Pulse 89   Temp (!) 97.1 F (36.2 C)   Ht 5' 5"$  (1.651 m)   Wt 209 lb (94.8 kg)   SpO2 98%   BMI 34.78 kg/m  {Vitals History (Optional):23777}  Physical Exam   No results found for any visits on 12/07/22.  {Labs (Optional):23779}  The ASCVD Risk score (Arnett DK, et al., 2019) failed to calculate for the following reasons:   The 2019 ASCVD risk score is only valid for ages 65 to 38    Assessment & Plan:   Problem List Items Addressed This Visit   None Visit Diagnoses     Attention deficit hyperactivity disorder (ADHD), predominantly inattentive type    -  Primary   Relevant Medications   methylphenidate (RITALIN) 20 MG tablet   Other Relevant Orders   POCT Urine drug screen   Tachycardia       Relevant Orders   EKG 12-Lead       No follow-ups on file.    Elwin Mocha, MD

## 2022-12-15 ENCOUNTER — Encounter: Payer: Self-pay | Admitting: Family Medicine

## 2023-01-06 ENCOUNTER — Telehealth: Payer: Medicaid Other | Admitting: Family Medicine

## 2023-01-10 ENCOUNTER — Other Ambulatory Visit: Payer: Self-pay | Admitting: Family Medicine

## 2023-01-10 DIAGNOSIS — F900 Attention-deficit hyperactivity disorder, predominantly inattentive type: Secondary | ICD-10-CM

## 2023-01-11 ENCOUNTER — Telehealth: Payer: Self-pay | Admitting: Family Medicine

## 2023-01-11 ENCOUNTER — Telehealth: Payer: Medicaid Other | Admitting: Family Medicine

## 2023-01-11 DIAGNOSIS — F900 Attention-deficit hyperactivity disorder, predominantly inattentive type: Secondary | ICD-10-CM

## 2023-01-11 DIAGNOSIS — F909 Attention-deficit hyperactivity disorder, unspecified type: Secondary | ICD-10-CM

## 2023-01-20 MED ORDER — METHYLPHENIDATE HCL 10 MG PO TABS: 20.0000 mg | ORAL_TABLET | Freq: Every morning | ORAL | 0 refills | Status: AC

## 2023-01-20 NOTE — Telephone Encounter (Signed)
PDMP reviewed during this encounter. Med refilled. UDS next month.  Haydee Salter, MD

## 2023-02-02 ENCOUNTER — Ambulatory Visit (HOSPITAL_BASED_OUTPATIENT_CLINIC_OR_DEPARTMENT_OTHER)
Admission: RE | Admit: 2023-02-02 | Discharge: 2023-02-02 | Disposition: A | Discharge status: Home / Self Care | Payer: Medicaid Other | Source: Ambulatory Visit | Attending: Family Medicine | Admitting: Family Medicine

## 2023-02-02 DIAGNOSIS — R000 Tachycardia, unspecified: Secondary | ICD-10-CM | POA: Diagnosis present

## 2023-02-02 LAB — ECHOCARDIOGRAM COMPLETE
Area-P 1/2: 5.46 cm2
S' Lateral: 2.9 cm

## 2023-06-20 ENCOUNTER — Encounter (HOSPITAL_BASED_OUTPATIENT_CLINIC_OR_DEPARTMENT_OTHER): Payer: Self-pay | Admitting: Emergency Medicine

## 2023-06-20 ENCOUNTER — Emergency Department (HOSPITAL_BASED_OUTPATIENT_CLINIC_OR_DEPARTMENT_OTHER)
Admission: EM | Admit: 2023-06-20 | Discharge: 2023-06-20 | Disposition: A | Discharge status: Home / Self Care | Payer: Medicaid Other | Attending: Emergency Medicine | Admitting: Emergency Medicine

## 2023-06-20 ENCOUNTER — Emergency Department (HOSPITAL_BASED_OUTPATIENT_CLINIC_OR_DEPARTMENT_OTHER): Payer: Medicaid Other

## 2023-06-20 ENCOUNTER — Other Ambulatory Visit: Payer: Self-pay

## 2023-06-20 DIAGNOSIS — R1012 Left upper quadrant pain: Secondary | ICD-10-CM | POA: Diagnosis present

## 2023-06-20 DIAGNOSIS — N83202 Unspecified ovarian cyst, left side: Secondary | ICD-10-CM | POA: Insufficient documentation

## 2023-06-20 DIAGNOSIS — R109 Unspecified abdominal pain: Secondary | ICD-10-CM

## 2023-06-20 LAB — COMPREHENSIVE METABOLIC PANEL
ALT: 22 U/L (ref 0–44)
AST: 19 U/L (ref 15–41)
Albumin: 4.3 g/dL (ref 3.5–5.0)
Alkaline Phosphatase: 45 U/L (ref 38–126)
Anion gap: 9 (ref 5–15)
BUN: 13 mg/dL (ref 6–20)
CO2: 23 mmol/L (ref 22–32)
Calcium: 9.2 mg/dL (ref 8.9–10.3)
Chloride: 105 mmol/L (ref 98–111)
Creatinine, Ser: 0.67 mg/dL (ref 0.44–1.00)
GFR, Estimated: >60 mL/min (ref >60)
Glucose, Bld: 105 mg/dL — ABNORMAL HIGH (ref 70–99)
Potassium: 3.5 mmol/L (ref 3.5–5.1)
Sodium: 137 mmol/L (ref 135–145)
Total Bilirubin: 0.4 mg/dL (ref 0.3–1.2)
Total Protein: 7.7 g/dL (ref 6.5–8.1)

## 2023-06-20 LAB — URINALYSIS, ROUTINE W REFLEX MICROSCOPIC
Bilirubin Urine: NEGATIVE
Glucose, UA: NEGATIVE mg/dL
Ketones, ur: 15 mg/dL — AB
Leukocytes,Ua: NEGATIVE
Nitrite: NEGATIVE
Protein, ur: NEGATIVE mg/dL
Specific Gravity, Urine: 1.025 (ref 1.005–1.030)
pH: 6 (ref 5.0–8.0)

## 2023-06-20 LAB — CBC WITH DIFFERENTIAL/PLATELET
Abs Immature Granulocytes: 0.03 10*3/uL (ref 0.00–0.07)
Basophils Absolute: 0 10*3/uL (ref 0.0–0.1)
Basophils Relative: 0 %
Eosinophils Absolute: 0.2 10*3/uL (ref 0.0–0.5)
Eosinophils Relative: 1 %
HCT: 36.7 % (ref 36.0–46.0)
Hemoglobin: 11.6 g/dL — ABNORMAL LOW (ref 12.0–15.0)
Immature Granulocytes: 0 %
Lymphocytes Relative: 43 %
Lymphs Abs: 5.1 10*3/uL — ABNORMAL HIGH (ref 0.7–4.0)
MCH: 24.1 pg — ABNORMAL LOW (ref 26.0–34.0)
MCHC: 31.6 g/dL (ref 30.0–36.0)
MCV: 76.1 fL — ABNORMAL LOW (ref 80.0–100.0)
Monocytes Absolute: 0.6 10*3/uL (ref 0.1–1.0)
Monocytes Relative: 5 %
Neutro Abs: 6 10*3/uL (ref 1.7–7.7)
Neutrophils Relative %: 51 %
Platelets: 280 10*3/uL (ref 150–400)
RBC: 4.82 MIL/uL (ref 3.87–5.11)
RDW: 16 % — ABNORMAL HIGH (ref 11.5–15.5)
WBC: 11.9 10*3/uL — ABNORMAL HIGH (ref 4.0–10.5)
nRBC: 0 % (ref 0.0–0.2)

## 2023-06-20 LAB — LIPASE, BLOOD: Lipase: 34 U/L (ref 11–51)

## 2023-06-20 LAB — URINALYSIS, MICROSCOPIC (REFLEX)

## 2023-06-20 LAB — PREGNANCY, URINE: Preg Test, Ur: NEGATIVE

## 2023-06-20 MED ORDER — KETOROLAC TROMETHAMINE 30 MG/ML IJ SOLN
30.0000 mg | Freq: Once | INTRAMUSCULAR | Status: AC
  Administered 2023-06-20: 30 mg via INTRAVENOUS
  Filled 2023-06-20: qty 1

## 2023-06-20 MED ORDER — SODIUM CHLORIDE 0.9 % IV BOLUS
1000.0000 mL | Freq: Once | INTRAVENOUS | Status: AC
  Administered 2023-06-20: 1000 mL via INTRAVENOUS

## 2023-06-20 NOTE — Discharge Instructions (Addendum)

## 2023-06-20 NOTE — ED Provider Notes (Signed)
Emergency Department Provider Note   I have reviewed the triage vital signs and the nursing notes.   HISTORY  Chief Complaint Flank Pain   HPI Laura Sexton is a 37 y.o. female with PMH of HLD and prior ureteral stone presents to the emergency department with left flank pain.  Symptoms been intermittent over the past couple of weeks but worsening in the past 48 hours.  She describes intermittent sharp pain to the left flank with occasional tenderness in the left upper quadrant.  No vomiting or diarrhea.  She did start her menstrual cycle in the past couple of days.  Some dysuria 2 weeks prior but that is mostly resolved.  No fevers.  No pain in the chest or shortness of breath.  Past Medical History:  Diagnosis Date   GDM (gestational diabetes mellitus) 2016   Gestational diabetes    Headache    History of gestational diabetes    History of pregnancy induced hypertension    Hyperlipidemia    IUD migration    Pneumonia    Prediabetes 2020   SUI (stress urinary incontinence, female)    Wears contact lenses     Review of Systems  Constitutional: No fever/chills Cardiovascular: Denies chest pain. Respiratory: Denies shortness of breath. Gastrointestinal: Positive left flank abdominal pain.  No nausea, no vomiting.   Musculoskeletal: Negative for back pain. Skin: Negative for rash. Neurological: Negative for headaches.  ____________________________________________   PHYSICAL EXAM:  VITAL SIGNS: ED Triage Vitals  Encounter Vitals Group     BP 06/20/23 1905 136/89     Pulse Rate 06/20/23 1905 87     Resp 06/20/23 1905 18     Temp 06/20/23 1905 98 F (36.7 C)     Temp Source 06/20/23 1905 Oral     SpO2 06/20/23 1905 98 %     Weight 06/20/23 1853 213 lb (96.6 kg)     Height 06/20/23 1853 5\' 4"  (1.626 m)   Constitutional: Alert and oriented. Well appearing and in no acute distress. Eyes: Conjunctivae are normal.  Head: Atraumatic. Nose: No  congestion/rhinnorhea. Mouth/Throat: Mucous membranes are moist.  Neck: No stridor.   Cardiovascular: Normal rate, regular rhythm. Good peripheral circulation. Grossly normal heart sounds.   Respiratory: Normal respiratory effort.  No retractions. Lungs CTAB. Gastrointestinal: Soft with mild LUQ tenderness. No peritonitis. No distention.  Musculoskeletal: No gross deformities of extremities. Neurologic:  Normal speech and language.  Skin:  Skin is warm, dry and intact. No rash noted.  ____________________________________________   LABS (all labs ordered are listed, but only abnormal results are displayed)  Labs Reviewed  URINALYSIS, ROUTINE W REFLEX MICROSCOPIC - Abnormal; Notable for the following components:      Result Value   APPearance HAZY (*)    Hgb urine dipstick MODERATE (*)    Ketones, ur 15 (*)    All other components within normal limits  COMPREHENSIVE METABOLIC PANEL - Abnormal; Notable for the following components:   Glucose, Bld 105 (*)    All other components within normal limits  CBC WITH DIFFERENTIAL/PLATELET - Abnormal; Notable for the following components:   WBC 11.9 (*)    Hemoglobin 11.6 (*)    MCV 76.1 (*)    MCH 24.1 (*)    RDW 16.0 (*)    Lymphs Abs 5.1 (*)    All other components within normal limits  URINALYSIS, MICROSCOPIC (REFLEX) - Abnormal; Notable for the following components:   Bacteria, UA RARE (*)    All  other components within normal limits  PREGNANCY, URINE  LIPASE, BLOOD   ____________________________________________  RADIOLOGY  CT Renal Stone Study  Result Date: 06/20/2023 CLINICAL DATA:  Progressively worsening left flank pain x2 weeks. EXAM: CT ABDOMEN AND PELVIS WITHOUT CONTRAST TECHNIQUE: Multidetector CT imaging of the abdomen and pelvis was performed following the standard protocol without IV contrast. RADIATION DOSE REDUCTION: This exam was performed according to the departmental dose-optimization program which includes  automated exposure control, adjustment of the mA and/or kV according to patient size and/or use of iterative reconstruction technique. COMPARISON:  December 30, 2021 FINDINGS: Lower chest: No acute abnormality. Hepatobiliary: No focal liver abnormality is seen. Status post cholecystectomy. No biliary dilatation. Pancreas: Unremarkable. No pancreatic ductal dilatation or surrounding inflammatory changes. Spleen: Normal in size without focal abnormality. Adrenals/Urinary Tract: Adrenal glands are unremarkable. Kidneys are normal, without renal calculi, focal lesion, or hydronephrosis. The urinary bladder is poorly distended and subsequently limited in evaluation. Mild diffuse urinary bladder wall thickening is noted. Stomach/Bowel: Stomach is within normal limits. Appendix appears normal. No evidence of bowel wall thickening, distention, or inflammatory changes. Vascular/Lymphatic: No significant vascular findings are present. No enlarged abdominal or pelvic lymph nodes. Reproductive: The uterus and right adnexa are unremarkable. An ill-defined 17 mm diameter cyst is seen within the posterior aspect of the left adnexa. Other: No abdominal wall hernia or abnormality. No abdominopelvic ascites. Musculoskeletal: No acute or significant osseous findings. IMPRESSION: 1. Mild diffuse urinary bladder wall thickening which may be secondary to poor distention. Correlation with urinalysis is recommended to exclude the presence of cystitis. 2. Evidence of prior cholecystectomy. 3. Small left adnexal cyst, likely ovarian in origin. Correlation with nonemergent pelvic ultrasound is recommended. Electronically Signed   By: Aram Candela M.D.   On: 06/20/2023 22:54    ____________________________________________   PROCEDURES  Procedure(s) performed:   Procedures  None  ____________________________________________   INITIAL IMPRESSION / ASSESSMENT AND PLAN / ED COURSE  Pertinent labs & imaging results that were  available during my care of the patient were reviewed by me and considered in my medical decision making (see chart for details).   This patient is Presenting for Evaluation of abdominal pain, which does require a range of treatment options, and is a complaint that involves a high risk of morbidity and mortality.  The Differential Diagnoses includes but is not exclusive to ectopic pregnancy, ovarian cyst, ovarian torsion, acute appendicitis, urinary tract infection, endometriosis, bowel obstruction, hernia, colitis, renal colic, gastroenteritis, volvulus etc.   Critical Interventions-    Medications  ketorolac (TORADOL) 30 MG/ML injection 30 mg (30 mg Intravenous Given 06/20/23 2108)  sodium chloride 0.9 % bolus 1,000 mL (0 mLs Intravenous Stopped 06/20/23 2333)    Reassessment after intervention: pain improved.   I decided to review pertinent External Data, and in summary patient with CT from 2023 with ureteral stone.   Clinical Laboratory Tests Ordered, included UA without infection. LFTs negative. CBC with mild leukocytosis.   Radiologic Tests Ordered, included CT renal. I independently interpreted the images and agree with radiology interpretation.   Cardiac Monitor Tracing which shows NSR.    Social Determinants of Health Risk patient is a non-smoker.   Medical Decision Making: Summary:  Patient presents emergency department for evaluation of left flank pain similar to prior kidney stones.  Some tenderness in the left upper quadrant which would not fit well with this diagnosis.  Plan for CT imaging along with labs and UA with reassess after Toradol.  Reevaluation with update and discussion with patient. Discussed imaging results and plan for Gyn follow up. Exam and presentation not consistent with torsion.   Patient's presentation is most consistent with acute presentation with potential threat to life or bodily function.   Disposition:  discharge  ____________________________________________  FINAL CLINICAL IMPRESSION(S) / ED DIAGNOSES  Final diagnoses:  Left flank pain  Cyst of left ovary    Note:  This document was prepared using Dragon voice recognition software and may include unintentional dictation errors.  Alona Bene, MD, [facility] Emergency Medicine    Jocsan Mcginley, Arlyss Repress, MD 06/20/23 [phone]

## 2023-06-20 NOTE — ED Triage Notes (Signed)
Left flank pain x 2 weeks that has progressively worsened in the past week. Reports pain is intermittent, sharp and occasionally radiates to left side of abdomen. Denies urinary sx.   Reports HO kidney stones. States feels the same.  Patient well appearing, NAD. Ambulatory with steady gait.

## 2024-05-15 ENCOUNTER — Encounter (HOSPITAL_BASED_OUTPATIENT_CLINIC_OR_DEPARTMENT_OTHER): Payer: Self-pay | Admitting: Emergency Medicine

## 2024-05-15 ENCOUNTER — Emergency Department (HOSPITAL_BASED_OUTPATIENT_CLINIC_OR_DEPARTMENT_OTHER)
Admission: EM | Admit: 2024-05-15 | Discharge: 2024-05-15 | Disposition: A | Discharge status: Home / Self Care | Attending: Emergency Medicine | Admitting: Emergency Medicine

## 2024-05-15 ENCOUNTER — Emergency Department (HOSPITAL_BASED_OUTPATIENT_CLINIC_OR_DEPARTMENT_OTHER)

## 2024-05-15 ENCOUNTER — Other Ambulatory Visit: Payer: Self-pay

## 2024-05-15 DIAGNOSIS — R1032 Left lower quadrant pain: Secondary | ICD-10-CM | POA: Insufficient documentation

## 2024-05-15 DIAGNOSIS — D72829 Elevated white blood cell count, unspecified: Secondary | ICD-10-CM | POA: Insufficient documentation

## 2024-05-15 DIAGNOSIS — R102 Pelvic and perineal pain: Secondary | ICD-10-CM | POA: Insufficient documentation

## 2024-05-15 LAB — COMPREHENSIVE METABOLIC PANEL WITH GFR
ALT: 20 U/L (ref 0–44)
AST: 19 U/L (ref 15–41)
Albumin: 4.5 g/dL (ref 3.5–5.0)
Alkaline Phosphatase: 45 U/L (ref 38–126)
Anion gap: 11 (ref 5–15)
BUN: 12 mg/dL (ref 6–20)
CO2: 23 mmol/L (ref 22–32)
Calcium: 9.4 mg/dL (ref 8.9–10.3)
Chloride: 106 mmol/L (ref 98–111)
Creatinine, Ser: 0.62 mg/dL (ref 0.44–1.00)
GFR, Estimated: >60 mL/min (ref >60)
Glucose, Bld: 116 mg/dL — ABNORMAL HIGH (ref 70–99)
Potassium: 4.3 mmol/L (ref 3.5–5.1)
Sodium: 141 mmol/L (ref 135–145)
Total Bilirubin: 0.3 mg/dL (ref 0.0–1.2)
Total Protein: 7.3 g/dL (ref 6.5–8.1)

## 2024-05-15 LAB — URINALYSIS, MICROSCOPIC (REFLEX)

## 2024-05-15 LAB — CBC
HCT: 32.3 % — ABNORMAL LOW (ref 36.0–46.0)
Hemoglobin: 10.3 g/dL — ABNORMAL LOW (ref 12.0–15.0)
MCH: 24.4 pg — ABNORMAL LOW (ref 26.0–34.0)
MCHC: 31.9 g/dL (ref 30.0–36.0)
MCV: 76.5 fL — ABNORMAL LOW (ref 80.0–100.0)
Platelets: 239 K/uL (ref 150–400)
RBC: 4.22 MIL/uL (ref 3.87–5.11)
RDW: 14.7 % (ref 11.5–15.5)
WBC: 15 K/uL — ABNORMAL HIGH (ref 4.0–10.5)
nRBC: 0 % (ref 0.0–0.2)

## 2024-05-15 LAB — URINALYSIS, ROUTINE W REFLEX MICROSCOPIC
Bilirubin Urine: NEGATIVE
Glucose, UA: NEGATIVE mg/dL
Ketones, ur: NEGATIVE mg/dL
Leukocytes,Ua: NEGATIVE
Nitrite: NEGATIVE
Protein, ur: 30 mg/dL — AB
Specific Gravity, Urine: 1.015 (ref 1.005–1.030)
pH: 8.5 — ABNORMAL HIGH (ref 5.0–8.0)

## 2024-05-15 LAB — LIPASE, BLOOD: Lipase: 37 U/L (ref 11–51)

## 2024-05-15 LAB — PREGNANCY, URINE: Preg Test, Ur: NEGATIVE

## 2024-05-15 MED ORDER — HYDROMORPHONE HCL 1 MG/ML IJ SOLN
1.0000 mg | Freq: Once | INTRAMUSCULAR | Status: AC
  Administered 2024-05-15: 1 mg via INTRAVENOUS
  Filled 2024-05-15: qty 1

## 2024-05-15 MED ORDER — SODIUM CHLORIDE 0.9 % IV BOLUS
1000.0000 mL | Freq: Once | INTRAVENOUS | Status: AC
  Administered 2024-05-15: 1000 mL via INTRAVENOUS

## 2024-05-15 MED ORDER — IOHEXOL 300 MG/ML  SOLN
100.0000 mL | Freq: Once | INTRAMUSCULAR | Status: AC | PRN
  Administered 2024-05-15: 100 mL via INTRAVENOUS

## 2024-05-15 MED ORDER — ONDANSETRON HCL 4 MG/2ML IJ SOLN
4.0000 mg | Freq: Once | INTRAMUSCULAR | Status: AC
  Administered 2024-05-15: 4 mg via INTRAVENOUS
  Filled 2024-05-15: qty 2

## 2024-05-15 MED ORDER — KETOROLAC TROMETHAMINE 15 MG/ML IJ SOLN
15.0000 mg | Freq: Once | INTRAMUSCULAR | Status: AC
  Administered 2024-05-15: 15 mg via INTRAVENOUS
  Filled 2024-05-15: qty 1

## 2024-05-15 NOTE — ED Provider Notes (Signed)
 [facility] Provider Note   CSN: [id number] Arrival date & time: 05/15/24  1813     Patient presents with: Flank Pain   Laura Sexton is a 38 y.o. female [place] emergency department with a chief complaint of left-sided pelvic pain.  Patient states that the pain started suddenly this morning believing that she woke up with the pain.  She states that it is intensified over the course of the day where she felt like she was going to blackout at work.  Patient states she took 2 rounds of 1000 mg of Tylenol  without relief, patient also took Zofran  at her work at approximately 1230, but does appreciate an episode of vomiting prior to arrival in the emergency department.  Denies fever but does appreciate chills.  Also appreciates generalized fatigue.  Denies chest pain or shortness of breath.  Denies urinary symptoms, denies hematuria, normal bowel movements, no hematochezia.  Patient has past medical history significant for hyperlipidemia, prediabetes, headaches, nephrolithiasis.  Patient states that this is different than her previous kidney stone pain.  States that the pain started in her left lower quadrant near her pelvic area and is now radiating to her left flank as well as down her left leg.  Patient ambulatory without assistance, denies any weakness of extremities.  {Add pertinent medical, surgical, social history, OB history to HPI:32947}  Flank Pain       Prior to Admission medications   Medication Sig Start Date End Date Taking? Authorizing Provider  atorvastatin  (LIPITOR) 20 MG tablet Take 1 tablet (20 mg total) by mouth daily. 11/08/22   Alen Manus HERO, MD  methylphenidate  (RITALIN ) 10 MG tablet Take 2 tablets (20 mg total) by mouth in the morning. 01/20/23 02/19/23  Alen Manus HERO, MD    Allergies: Pork-derived products    Review of Systems  Genitourinary:  Positive for flank pain.    Updated Vital Signs BP 110/77 (BP Location:  Left Arm)   Pulse 78   Temp 97.6 F (36.4 C)   Resp 16   Ht 5' 4 (1.626 m)   Wt 89.4 kg   SpO2 100%   BMI 33.81 kg/m   Physical Exam Vitals and nursing note reviewed.  Constitutional:      General: She is awake. She is not in acute distress.    Appearance: Normal appearance. She is not ill-appearing, toxic-appearing or diaphoretic.  HENT:     Head: Normocephalic and atraumatic.  Eyes:     General: No scleral icterus. Cardiovascular:     Rate and Rhythm: Normal rate and regular rhythm.  Pulmonary:     Effort: Pulmonary effort is normal. No respiratory distress.     Breath sounds: Normal breath sounds. No wheezing, rhonchi or rales.  Abdominal:     General: Abdomen is flat. There is no distension.     Tenderness: There is abdominal tenderness (LLQ suprpubic area tender to palpation). There is no right CVA tenderness, left CVA tenderness, guarding or rebound.  Musculoskeletal:        General: Normal range of motion.     Right lower leg: No edema.     Left lower leg: No edema.  Skin:    General: Skin is warm and dry.     Capillary Refill: Capillary refill takes less than 2 seconds.  Neurological:     General: No focal deficit present.     Mental Status: She is alert and oriented to person, place, and time.  Psychiatric:        Mood and Affect: Mood normal.        Behavior: Behavior normal. Behavior is cooperative.     (all labs ordered are listed, but only abnormal results are displayed) Labs Reviewed  URINALYSIS, ROUTINE W REFLEX MICROSCOPIC  LIPASE, BLOOD  COMPREHENSIVE METABOLIC PANEL WITH GFR  CBC  HCG, SERUM, QUALITATIVE    EKG: None  Radiology: No results found.  {Document cardiac monitor, telemetry assessment procedure when appropriate:32947} Procedures   Medications Ordered in the ED  ondansetron  (ZOFRAN ) injection 4 mg (has no administration in time range)  sodium chloride  0.9 % bolus 1,000 mL (has no administration in time range)  HYDROmorphone   (DILAUDID ) injection 1 mg (has no administration in time range)      {Click here for ABCD2, HEART and other calculators REFRESH Note before signing:1}                              Medical Decision Making Amount and/or Complexity of Data Reviewed Labs: ordered. Radiology: ordered.  Risk Prescription drug management.   Patient presents to the ED for concern of ***, this involves an extensive number of treatment options, and is a complaint that carries with it a high risk of complications and morbidity.  The differential diagnosis includes ***   Co morbidities that complicate the patient evaluation  ***   Additional history obtained:  Additional history obtained from *** {Blank multiple:19196::EMS,Family,Nursing,Outside Medical Records,Past Admission}   External records from outside source obtained and reviewed including ***   Lab Tests:  I Ordered, and personally interpreted labs.  The pertinent results include:  ***   Imaging Studies ordered:  I ordered imaging studies including ***  I independently visualized and interpreted imaging which showed *** I agree with the radiologist interpretation   Cardiac Monitoring:  The patient was maintained on a cardiac monitor.  I personally viewed and interpreted the cardiac monitored which showed an underlying rhythm of: ***   Medicines ordered and prescription drug management:  I ordered medication including ***  for ***  Reevaluation of the patient after these medicines showed that the patient {resolved/improved/worsened:23923::improved} I have reviewed the patients home medicines and have made adjustments as needed   Test Considered:  ***   Critical Interventions:  ***   Consultations Obtained:  I requested consultation with the ***,  and discussed lab and imaging findings as well as pertinent plan - they recommend: ***   Problem List / ED Course:  Patient declined pelvic examination or testing  for STIs today, stating that she will follow-up with her OBGYN    Reevaluation:  After the interventions noted above, I reevaluated the patient and found that they have :{resolved/improved/worsened:23923::improved}   Social Determinants of Health:  ***   Dispostion:  After consideration of the diagnostic results and the patients response to treatment, I feel that the patent would benefit from ***.    {Document critical care time when appropriate  Document review of labs and clinical decision tools ie CHADS2VASC2, etc  Document your independent review of radiology images and any outside records  Document your discussion with family members, caretakers and with consultants  Document social determinants of health affecting pt's care  Document your decision making why or why not admission, treatments were needed:32947:::1}   Final diagnoses:  None    ED Discharge Orders     None

## 2024-05-15 NOTE — ED Triage Notes (Signed)
 Pt c/o L flank pain since this AM, also reports cold sweats, emesis, restlessness.   Hx of kidney stones, this feels different.   Started menses 2-3 days ago. Denies urinary sx.   Took zofran  appx 1230. Emesis appx 10 min pta.

## 2024-05-15 NOTE — Discharge Instructions (Addendum)
 It was a pleasure to take care of you today.  Based on your history, physical exam, labs, and imaging I feel you are safe for discharge.  Today the imaging of your abdomen and the ultrasound of your pelvis did not show any acute abnormality or cause for your symptoms.  Of note the ultrasound and CT scan did show an ovarian cyst which was previously known about.  Please continue supportive care at home and take ibuprofen  as well as Tylenol  to help with pain relief.  These medications can be alternated every 3-4 hours to help with pain and inflammation.  Please continue to monitor your symptoms and if you experience any of the following symptoms including but not limited to fever, chills, worsening pain, vaginal discharge, excessive vomiting, or other concerning symptom please return to the emergency department or seek further medical care.  I do recommend that you follow-up with your OB/GYN as soon as possible for ongoing diagnosis and treatment.  If symptoms persist or worsen recommend follow-up with OB/GYN or primary care provider within 48 hours.

## 2024-05-15 NOTE — ED Notes (Signed)
 AVS provided by edp was reviewed with the pt. Pt verbalized understanding with no additional questions at this time. Pt to go home with sister at bedside

## 2024-05-16 ENCOUNTER — Emergency Department (HOSPITAL_BASED_OUTPATIENT_CLINIC_OR_DEPARTMENT_OTHER)
Admission: EM | Admit: 2024-05-16 | Discharge: 2024-05-16 | Disposition: A | Discharge status: Home / Self Care | Attending: Emergency Medicine | Admitting: Emergency Medicine

## 2024-05-16 ENCOUNTER — Emergency Department (HOSPITAL_BASED_OUTPATIENT_CLINIC_OR_DEPARTMENT_OTHER)

## 2024-05-16 ENCOUNTER — Encounter (HOSPITAL_BASED_OUTPATIENT_CLINIC_OR_DEPARTMENT_OTHER): Payer: Self-pay | Admitting: Emergency Medicine

## 2024-05-16 ENCOUNTER — Other Ambulatory Visit: Payer: Self-pay

## 2024-05-16 DIAGNOSIS — R1032 Left lower quadrant pain: Secondary | ICD-10-CM | POA: Insufficient documentation

## 2024-05-16 LAB — URINALYSIS, ROUTINE W REFLEX MICROSCOPIC
Bilirubin Urine: NEGATIVE
Glucose, UA: NEGATIVE mg/dL
Ketones, ur: NEGATIVE mg/dL
Nitrite: NEGATIVE
Protein, ur: NEGATIVE mg/dL
Specific Gravity, Urine: 1.015 (ref 1.005–1.030)
pH: 7 (ref 5.0–8.0)

## 2024-05-16 LAB — PREGNANCY, URINE: Preg Test, Ur: NEGATIVE

## 2024-05-16 LAB — URINALYSIS, MICROSCOPIC (REFLEX)

## 2024-05-16 MED ORDER — NAPROXEN 500 MG PO TABS: 500.0000 mg | ORAL_TABLET | Freq: Two times a day (BID) | ORAL | 0 refills | Status: AC

## 2024-05-16 MED ORDER — ONDANSETRON 4 MG PO TBDP
4.0000 mg | ORAL_TABLET | Freq: Once | ORAL | Status: AC
  Administered 2024-05-16: 4 mg via ORAL
  Filled 2024-05-16: qty 1

## 2024-05-16 MED ORDER — SODIUM CHLORIDE 0.9 % IV BOLUS
1000.0000 mL | Freq: Once | INTRAVENOUS | Status: AC
  Administered 2024-05-16: 1000 mL via INTRAVENOUS

## 2024-05-16 MED ORDER — HYDROCODONE-ACETAMINOPHEN 5-325 MG PO TABS
1.0000 | ORAL_TABLET | Freq: Once | ORAL | Status: AC
  Administered 2024-05-16: 1 via ORAL
  Filled 2024-05-16: qty 1

## 2024-05-16 MED ORDER — ONDANSETRON 4 MG PO TBDP: 4.0000 mg | ORAL_TABLET | Freq: Three times a day (TID) | ORAL | 0 refills | Status: AC | PRN

## 2024-05-16 MED ORDER — OXYCODONE HCL 5 MG PO TABS: 5.0000 mg | ORAL_TABLET | Freq: Four times a day (QID) | ORAL | 0 refills | Status: AC | PRN

## 2024-05-16 MED ORDER — KETOROLAC TROMETHAMINE 15 MG/ML IJ SOLN
15.0000 mg | Freq: Once | INTRAMUSCULAR | Status: AC
  Administered 2024-05-16: 15 mg via INTRAMUSCULAR
  Filled 2024-05-16: qty 1

## 2024-05-16 NOTE — Discharge Instructions (Addendum)
 Your repeat ultrasound today redemonstrates good blood flow to both of your ovaries.  Please follow-up with OB/GYN on Friday as discussed. You can take 1000 mg of Tylenol  every 8 hours.  Take naproxen  every 12 hours.  Take oxycodone  for breakthrough pain.  Take Zofran  as needed for nausea vomiting.  Return to emergency room with new or worsening symptoms.

## 2024-05-16 NOTE — ED Triage Notes (Signed)
 Pt reports ongoing LLQ ABD pain that radiates down the left leg, was seen for the same yesterday

## 2024-05-16 NOTE — ED Provider Notes (Addendum)
 [place] [facility] Provider Note   CSN: [id number] Arrival date & time: 05/16/24  1406     Patient presents with: Abdominal Pain   Laura Sexton is a 38 y.o. female patient with noncontributory past medical history reports to emergency room with complaint of left lower pelvic pain.  She reports that this started 2 days ago and that she was seen here yesterday for the same thing.  She was sent home without pain medication and reports she is not doing well with just Advil  for pain control.  She denies any significant change in symptoms.  Denies nausea vomiting fever.  Locates pain to left lower pelvis seems to be rating down her left leg.  She reports pain is moderate right now but was quite severe when it started.  She has been tolerating oral intake at home.    Abdominal Pain      Prior to Admission medications   Medication Sig Start Date End Date Taking? Authorizing Provider  atorvastatin  (LIPITOR) 20 MG tablet Take 1 tablet (20 mg total) by mouth daily. 11/08/22   Alen Manus HERO, MD  methylphenidate  (RITALIN ) 10 MG tablet Take 2 tablets (20 mg total) by mouth in the morning. 01/20/23 02/19/23  Alen Manus HERO, MD    Allergies: Pork-derived products    Review of Systems  Gastrointestinal:  Positive for abdominal pain.    Updated Vital Signs BP 94/70 (BP Location: Right Arm)   Pulse 84   Temp 98.6 F (37 C) (Oral)   Resp 17   Ht 5' 4 (1.626 m)   Wt 89.4 kg   SpO2 100%   BMI 33.83 kg/m   Physical Exam Vitals and nursing note reviewed.  Constitutional:      General: She is not in acute distress.    Appearance: She is not toxic-appearing.  HENT:     Head: Normocephalic and atraumatic.  Eyes:     General: No scleral icterus.    Conjunctiva/sclera: Conjunctivae normal.  Cardiovascular:     Rate and Rhythm: Normal rate and regular rhythm.     Pulses: Normal pulses.     Heart sounds: Normal heart sounds.  Pulmonary:      Effort: Pulmonary effort is normal. No respiratory distress.     Breath sounds: Normal breath sounds.  Abdominal:     General: Abdomen is flat. Bowel sounds are normal.     Palpations: Abdomen is soft.     Tenderness: There is abdominal tenderness in the left lower quadrant.  Skin:    General: Skin is warm and dry.     Findings: No lesion.  Neurological:     General: No focal deficit present.     Mental Status: She is alert and oriented to person, place, and time. Mental status is at baseline.     (all labs ordered are listed, but only abnormal results are displayed) Labs Reviewed  PREGNANCY, URINE  URINALYSIS, ROUTINE W REFLEX MICROSCOPIC    EKG: None  Radiology: CT ABDOMEN PELVIS W CONTRAST Result Date: 05/15/2024 CLINICAL DATA:  Sudden onset of pelvic pain. Left flank pain. History of kidney stones. EXAM: CT ABDOMEN AND PELVIS WITH CONTRAST TECHNIQUE: Multidetector CT imaging of the abdomen and pelvis was performed using the standard protocol following bolus administration of intravenous contrast. RADIATION DOSE REDUCTION: This exam was performed according to the departmental dose-optimization program which includes automated exposure control, adjustment of the mA and/or kV according to patient size and/or use of  iterative reconstruction technique. CONTRAST:  100mL OMNIPAQUE  IOHEXOL  300 MG/ML  SOLN COMPARISON:  Pelvic ultrasound earlier today.  CT 06/20/2023 FINDINGS: Lower chest: Clear lung bases. Hepatobiliary: No focal liver abnormality is seen. Status post cholecystectomy. No biliary dilatation. Pancreas: No ductal dilatation or inflammation. Spleen: Normal in size without focal abnormality. Adrenals/Urinary Tract: Normal adrenal glands. No hydronephrosis no renal calculi. No perinephric fat stranding. Small cyst in the lower left kidney. No further follow-up imaging is recommended. Decompressed ureters. Urinary bladder is nondistended. No bladder stone. Stomach/Bowel: Stomach is  within normal limits. Appendix appears normal. No evidence of bowel wall thickening, distention, or inflammatory changes. Small to moderate volume of stool in the colon. Vascular/Lymphatic: Normal caliber abdominal aorta. Retroaortic left renal vein. Patent portal vein. No enlarged lymph nodes in the abdomen or pelvis. Reproductive: Anteverted uterus. Left ovarian cyst was evaluated on ultrasound earlier today. There is no adnexal inflammation. Other: No free air or free fluid. Diminutive fat containing umbilical hernia. Musculoskeletal: There are no acute or suspicious osseous abnormalities. IMPRESSION: 1. No acute abnormality in the abdomen/pelvis. 2. Left ovarian cyst was evaluated on ultrasound earlier today. No adnexal inflammation. Electronically Signed   By: Andrea Gasman M.D.   On: 05/15/2024 21:26   US  PELVIC COMPLETE W TRANSVAGINAL AND TORSION R/O Result Date: 05/15/2024 CLINICAL DATA:  [id number]. Premenopausal 38 year old female with right greater than left pelvic pain of sudden onset. EXAM: TRANSABDOMINAL AND TRANSVAGINAL ULTRASOUND OF PELVIS DOPPLER ULTRASOUND OF OVARIES TECHNIQUE: Both transabdominal and transvaginal ultrasound examinations of the pelvis were performed. Transabdominal technique was performed for global imaging of the pelvis including uterus, ovaries, adnexal regions, and pelvic cul-de-sac. It was necessary to proceed with endovaginal exam following the transabdominal exam to visualize the ovaries and endometrium better. Color and duplex Doppler ultrasound was utilized to evaluate blood flow to the ovaries. COMPARISON:  CT without contrast 06/20/2023. FINDINGS: Uterus Measurements: Anteverted measuring 7.7 x 4.0 x 5.0 cm = volume: 79.5 mL. No fibroids or other mass visualized. There is trace fluid in the endocervical canal. The cervix is otherwise unremarkable. Endometrium Thickness: 4.9 mm. No focal abnormality visualized no abnormal color flow. Right ovary Measurements: 4.5 x 2.3 x  2.7 cm = volume: 14.6 mL. There are a few simple follicles, largest is 1.4 cm. No suspicious lesion. Left ovary Measurements: 6.8 x 2.7 x 4.8 cm = volume: 46.3 mL. There is a 4.5 x 2.4 x 4.5 cm anechoic simple cyst. There are a few simple follicles up to 1 cm. There are no suspicious lesions. Pulsed Doppler evaluation of both ovaries demonstrates normal low-resistance arterial and venous waveforms. Other findings There is trace anechoic left adnexal free fluid with extension into the cul-de-sac space. No complex fluid or mass is seen. IMPRESSION: 1. 4.5 cm simple cyst in the left ovary. No follow-up imaging is recommended. Reference: Radiology 2019 Nov;293(2):359-371. 2. Trace anechoic left adnexal free fluid with extension into the cul-de-sac space. 3. Trace fluid in the endocervical canal. 4. Otherwise negative ultrasound. No evidence of ovarian torsion or solid mass. Electronically Signed   By: Francis Quam M.D.   On: 05/15/2024 20:29     Procedures   Medications Ordered in the ED  ketorolac  (TORADOL ) 15 MG/ML injection 15 mg (has no administration in time range)  HYDROcodone -acetaminophen  (NORCO/VICODIN) 5-325 MG per tablet 1 tablet (has no administration in time range)  ondansetron  (ZOFRAN -ODT) disintegrating tablet 4 mg (has no administration in time range)  Medical Decision Making Amount and/or Complexity of Data Reviewed Labs: ordered. Radiology: ordered.  Risk Prescription drug management.   This patient presents to the ED for concern of left lower quadrant abdominal pain, this involves an extensive number of treatment options, and is a complaint that carries with it a high risk of complications and morbidity.  The differential diagnosis includes ectopic pregnancy, ovarian torsion, diverticulitis  Additional history obtained:  Additional history obtained from 05/15/2024 ED visit for abdominal pain.  Diagnosed with left adnexal cyst.  No sign of  ovarian torsion.   Lab Tests:  I personally interpreted labs.  The pertinent results include:   UA negative.  Urine pregnancy negative.   Imaging Studies ordered:  Did reimage to rule out ovarian torsion.  Ultrasound today redemonstrated good blood flow to the ovary.   Cardiac Monitoring: / EKG:  The patient was maintained on a cardiac monitor.     Problem List / ED Course / Critical interventions / Medication management  Patient reporting to emergency room with left lower quadrant pain.  Was seen here yesterday for similar thing but pain has not been well-controlled.  On my exam she continues to have left lower pelvic pain on exam.  She was diagnosed with left sided adnexal cyst yesterday which feels likely contributing to her symptoms.  Given her cyst we did discuss obtaining repeat imaging for rule out of ovarian torsion.  We discussed trying pain medicine prior to repeat imaging.  She does note currently on her period, but this is more painful than normal.  I ordered medication including oral, Zofran , Norco  Reevaluation of the patient after these medicines showed that the patient improved I have reviewed the patients home medicines and have made adjustments as needed Patient has OB/GYN appointment on Friday for her left adnexal cyst.  Feel she stable for discharge with outpatient follow-up with them.       Final diagnoses:  Left lower quadrant abdominal pain    ED Discharge Orders          Ordered    ondansetron  (ZOFRAN -ODT) 4 MG disintegrating tablet  Every 8 hours PRN        05/16/24 1556    oxyCODONE  (ROXICODONE ) 5 MG immediate release tablet  Every 6 hours PRN        05/16/24 1556    naproxen  (NAPROSYN ) 500 MG tablet  2 times daily        05/16/24 1556               Diallo Ponder, Warren SAILOR, PA-C 05/16/24 1559    Ruthe Cornet, DO 05/16/24 1849    Syvanna Ciolino, Warren SAILOR, PA-C 05/16/24 1907    Ruthe Cornet, DO 05/16/24 1921
